# Patient Record
Sex: Male | Born: 1935 | Race: White | Hispanic: No | Marital: Married | Smoking: Never smoker
Health system: Southern US, Community
[De-identification: ages and names within clinical notes are randomized; demographics above are authoritative.]

## PROBLEM LIST (undated history)

## (undated) DIAGNOSIS — K219 Gastro-esophageal reflux disease without esophagitis: Secondary | ICD-10-CM

## (undated) DIAGNOSIS — E119 Type 2 diabetes mellitus without complications: Secondary | ICD-10-CM

## (undated) DIAGNOSIS — Z8489 Family history of other specified conditions: Secondary | ICD-10-CM

## (undated) DIAGNOSIS — R200 Anesthesia of skin: Secondary | ICD-10-CM

## (undated) DIAGNOSIS — K759 Inflammatory liver disease, unspecified: Secondary | ICD-10-CM

## (undated) DIAGNOSIS — IMO0001 Reserved for inherently not codable concepts without codable children: Secondary | ICD-10-CM

## (undated) DIAGNOSIS — B029 Zoster without complications: Secondary | ICD-10-CM

## (undated) DIAGNOSIS — B269 Mumps without complication: Secondary | ICD-10-CM

## (undated) DIAGNOSIS — J449 Chronic obstructive pulmonary disease, unspecified: Secondary | ICD-10-CM

## (undated) DIAGNOSIS — Z9221 Personal history of antineoplastic chemotherapy: Secondary | ICD-10-CM

## (undated) DIAGNOSIS — I10 Essential (primary) hypertension: Secondary | ICD-10-CM

## (undated) DIAGNOSIS — H547 Unspecified visual loss: Secondary | ICD-10-CM

## (undated) DIAGNOSIS — Z87898 Personal history of other specified conditions: Secondary | ICD-10-CM

## (undated) DIAGNOSIS — B059 Measles without complication: Secondary | ICD-10-CM

## (undated) DIAGNOSIS — C801 Malignant (primary) neoplasm, unspecified: Secondary | ICD-10-CM

## (undated) HISTORY — PX: BACK SURGERY: SHX140

## (undated) HISTORY — PX: OTHER SURGICAL HISTORY: SHX169

## (undated) HISTORY — PX: EYE SURGERY: SHX253

## (undated) HISTORY — PX: APPENDECTOMY: SHX54

---

## 2015-03-24 ENCOUNTER — Ambulatory Visit: Payer: Self-pay | Admitting: Orthopedic Surgery

## 2015-03-24 NOTE — H&P (Signed)
Christopher Jennings DOB: 08-25-1933 Married / Language: English / Race: White Male Date of Admission:  03/29/2015 CC:  Left hip pain following a fall History of Present Illness The patient is a 81 year old male who comes in  for a preoperative History and Physical. The patient is scheduled for a left hip pinning to be performed by Dr. Dione Plover. [name], MD at [facility] on 03/29/2015. The patient is a 81 year old male who presented with a left hip problem. The patient reports left hip problems including pain symptoms that have been present for 6 week(s). The symptoms began following a specific injury. The injury occurred due to a fall while the patient was at home. Prior to being seen, the patient was previously evaluated by a primary physician, in [place]. He has just recently moved here. Previous workup for this problem has included hip x-rays (showed impacted femoral neck fracture with severe underlying OA). Previous treatment for this problem has included restricted activity, use of a walker and opioid analgesics (Oxycodone, mainly at night). Note for "Hip problem": He has also been undergoing chemo; he reports his last session was last week. Christopher Jennings was seen in the office upon recommendation from his daughter who is a patient at [facility]. He came in for evaluation and further treatment for his left hip injury. He sustained a fall about 6-8 weeks ago at home when he was digging up the lid to his septic tank and unfortunately slipped on some loose dirt and fell and experienced pain in the left hip area. He was able to stand and walk on the leg so he did not seek medical treatment at that time. His pain was mainly load bearing, but no pain at rest or hardly any pain at night. He had an old crutch that he started to use for mobility. He has been using it up until just a couple of days ago when he had to go to a walker for better support. He delayed seeking treatment for the hip  due to other important ongoing issues. They were getting ready to move and just relocated last week from [place], [place] to [place], [place]. He has also been undergoing chemotherapy recently for a primary small cell carcinoma lung cancer which has already been found to have mestatasized to bone in the area of his low back and his shoulder. He states that he just finished up his chemotherapy this past Friday.  At this time., he has mainly weight bearing pain in the left hip which will radiate down the leg some. He also has some groin and buttock pain. No significant pain as rest and only a little at night when he moves around. He was seen at his PCP back in [place] about two weeks ago and sent for x-rays which proved positive for an impacted left femoral neck fracture. He waited until now to come in due to the recent move. The pain in the hip had started to get a little better but it changed for the worse about a week ago. He has been limited on his activity and movement recently which may just be due to the fact that he had to work more during the recent relocation from [place] to [place]. The femoral neck fracture was confirmed on radiograph in the office and surgery was discussed. Risks and benefits of the left hip pinning were discussed with the patient and he elects to proceed with surgery.  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 Congestive Heart Failure Mother. Depression child Cancer Sister. Heart disease in male family member  before age 29 Diabetes Mellitus Brother, Father, Sister. First Degree Relatives reported Heart Disease Brother.  Social History Exercise Exercises daily; does running / walking Living situation live with spouse Marital status married Current work status retired Children 3 Never consumed alcohol 03/23/2015: Never consumed alcohol Tobacco use Former smoker. 03/23/2015: smoke(d) 3 or more pack(s) per day uses less than 1/2 can(s) smokeless per week Tobacco / smoke exposure 03/23/2015: no No history of drug/alcohol rehab Not under pain contract Number of flights of stairs before winded 2-3  Medication History OxyCODONE HCl ER ('15MG'$  Tab 12HR Deter, Oral as needed) Active. Glimepiride (Oral) Specific dose unknown - Active. Zocor ('20MG'$  Tablet, Oral) Active. HumaLOG Mix 50/50 KwikPen ((50-50) 100UNIT/ML Susp Pen-inj, Subcutaneous) Active. Fentanyl (50MCG/HR , Transdermal) Active. Ondansetron ('8MG'$  Tablet Disperse, Oral) Active.  Past Surgical History  Appendectomy Cataract Surgery bilateral Spinal Surgery  Review of Systems General Present- Weight Loss. Not Present- Chills, Fatigue, Fever, Memory Loss, Night Sweats and Weight Gain. Skin Not Present- Eczema, Hives, Itching, Lesions and Rash. HEENT Not Present- Dentures, Double Vision, Headache, Hearing Loss, Tinnitus and Visual Loss. Respiratory Not Present- Allergies, Chronic Cough, Coughing up blood, Shortness of breath at rest and Shortness of breath with exertion. Cardiovascular Not Present- Chest Pain, Difficulty Breathing Lying Down, Murmur, Palpitations, Racing/skipping heartbeats and Swelling. Gastrointestinal Present- Vomiting (about 4 weeks ago due to chemo). Not Present- Abdominal Pain, Bloody Stool, Constipation, Diarrhea, Difficulty Swallowing, Heartburn, Jaundice, Loss of appetitie and Nausea. Male Genitourinary Present- Urinary frequency. Not Present- Blood in Urine, Discharge, Flank Pain,  Incontinence, Painful Urination, Urgency, Urinary Retention, Urinating at Night and Weak urinary stream. Musculoskeletal Present- Joint Pain (left hip load bearing pain). Not Present- Back Pain, Joint Swelling, Morning Stiffness, Muscle Pain, Muscle Weakness and Spasms. Neurological Not Present- Blackout spells, Difficulty with balance, Dizziness, Paralysis, Tremor and Weakness. Psychiatric Not Present- Insomnia.  Vitals  Weight: 182 lb Height: 73in Body Surface Area: 2.07 m Body Mass Index: 24.01 kg/m  BP: 114/58 (Sitting, Right Arm, Standard)  Physical Exam General Mental Status -Alert, cooperative and good historian. General Appearance-pleasant, Not in acute distress. Orientation-Oriented X3. Build & Nutrition-Well nourished and Well developed.  Head and Neck Head-normocephalic, atraumatic . Neck Global Assessment - supple, no bruit auscultated on the right, no bruit auscultated on the left.  Eye Vision-Wears corrective lenses. Note: blindness in left eye secondary to past shingles. Can only make out silhouettes. Reduce peripheral vision left eye Eyeball - Left -Note: injected. Pupil - Bilateral-Regular and Round. Motion - Bilateral-EOMI.  ENMT Note: upper and lower dentures  Chest and Lung Exam Auscultation Breath sounds - clear at anterior chest wall and clear at posterior chest wall. Adventitious sounds - No Adventitious sounds.  Cardiovascular Auscultation Rhythm - Regular rate and rhythm. Heart Sounds - S1 WNL and S2 WNL. Murmurs & Other Heart Sounds - Auscultation of the heart reveals - No Murmurs.  Abdomen Palpation/Percussion Tenderness - Abdomen is non-tender to palpation. Rigidity (guarding) - Abdomen is soft. Auscultation Auscultation of the abdomen reveals -  Bowel sounds normal.  Male Genitourinary Note: Not done, not pertinent to present illness   Musculoskeletal Note: Left hip flexion about 120, internal 25 with mild  discomfort, external 35 with mild discomfort, abduction limited in wheelchair of about 30. Motor function intact - moving foot and toes well on exam. Sensation intact to left leg.   Assessment & Plan Closed fracture of neck of left femur, initial encounter (S72.002A) Note:Surgical Plans: Left Hip Pinning  Disposition: Home  PCP: Does not have one yet  Anesthesia Issues: None  Signed electronically by Ok Edwards, III PA-C

## 2015-03-24 NOTE — Progress Notes (Signed)
Preoperative surgical orders have been place into the Epic hospital system for Christopher Jennings on 03/24/2015, 12:33 PM  by Mickel Crow for surgery on 03-29-2015.  Preop Hip Pinning orders including IV Tylenol, and IV Decadron as long as there are no contraindications to the above medications. Arlee Muslim, PA-C

## 2015-03-28 ENCOUNTER — Encounter (HOSPITAL_COMMUNITY)
Admission: RE | Admit: 2015-03-28 | Discharge: 2015-03-28 | Disposition: A | Discharge status: Home / Self Care | Payer: Medicare PPO | Source: Ambulatory Visit | Attending: Orthopedic Surgery | Admitting: Orthopedic Surgery

## 2015-03-28 ENCOUNTER — Ambulatory Visit (HOSPITAL_COMMUNITY)
Admission: RE | Admit: 2015-03-28 | Discharge: 2015-03-28 | Disposition: A | Discharge status: Home / Self Care | Payer: Medicare PPO | Source: Ambulatory Visit | Attending: Anesthesiology | Admitting: Anesthesiology

## 2015-03-28 ENCOUNTER — Encounter (HOSPITAL_COMMUNITY): Payer: Self-pay

## 2015-03-28 DIAGNOSIS — E119 Type 2 diabetes mellitus without complications: Secondary | ICD-10-CM

## 2015-03-28 DIAGNOSIS — Z85118 Personal history of other malignant neoplasm of bronchus and lung: Secondary | ICD-10-CM | POA: Insufficient documentation

## 2015-03-28 DIAGNOSIS — Z87891 Personal history of nicotine dependence: Secondary | ICD-10-CM

## 2015-03-28 DIAGNOSIS — J8410 Pulmonary fibrosis, unspecified: Secondary | ICD-10-CM

## 2015-03-28 DIAGNOSIS — Z01810 Encounter for preprocedural cardiovascular examination: Secondary | ICD-10-CM

## 2015-03-28 HISTORY — DX: Anesthesia of skin: R20.0

## 2015-03-28 HISTORY — DX: Malignant (primary) neoplasm, unspecified: C80.1

## 2015-03-28 HISTORY — DX: Personal history of other specified conditions: Z87.898

## 2015-03-28 HISTORY — DX: Zoster without complications: B02.9

## 2015-03-28 HISTORY — DX: Essential (primary) hypertension: I10

## 2015-03-28 HISTORY — DX: Personal history of antineoplastic chemotherapy: Z92.21

## 2015-03-28 HISTORY — DX: Type 2 diabetes mellitus without complications: E11.9

## 2015-03-28 HISTORY — DX: Reserved for inherently not codable concepts without codable children: IMO0001

## 2015-03-28 HISTORY — DX: Unspecified visual loss: H54.7

## 2015-03-28 HISTORY — DX: Family history of other specified conditions: Z84.89

## 2015-03-28 HISTORY — DX: Mumps without complication: B26.9

## 2015-03-28 HISTORY — DX: Chronic obstructive pulmonary disease, unspecified: J44.9

## 2015-03-28 HISTORY — DX: Inflammatory liver disease, unspecified: K75.9

## 2015-03-28 HISTORY — DX: Measles without complication: B05.9

## 2015-03-28 HISTORY — DX: Gastro-esophageal reflux disease without esophagitis: K21.9

## 2015-03-28 LAB — COMPREHENSIVE METABOLIC PANEL
ALBUMIN: 3.5 g/dL (ref 3.5–5.0)
ALK PHOS: 169 U/L — AB (ref 38–126)
ALT: 10 U/L — ABNORMAL LOW (ref 17–63)
AST: 22 U/L (ref 15–41)
Anion gap: 6 (ref 5–15)
BUN: 12 mg/dL (ref 6–20)
CO2: 26 mmol/L (ref 22–32)
Calcium: 8.8 mg/dL — ABNORMAL LOW (ref 8.9–10.3)
Chloride: 101 mmol/L (ref 101–111)
Creatinine, Ser: 0.88 mg/dL (ref 0.61–1.24)
Glucose, Bld: 209 mg/dL — ABNORMAL HIGH (ref 65–99)
Potassium: 4.2 mmol/L (ref 3.5–5.1)
Sodium: 133 mmol/L — ABNORMAL LOW (ref 135–145)
TOTAL PROTEIN: 8.1 g/dL (ref 6.5–8.1)
Total Bilirubin: 0.8 mg/dL (ref 0.3–1.2)

## 2015-03-28 LAB — APTT: aPTT: 29 seconds (ref 24–37)

## 2015-03-28 LAB — URINALYSIS, ROUTINE W REFLEX MICROSCOPIC
Bilirubin Urine: NEGATIVE
Glucose, UA: >1000 mg/dL — AB
HGB URINE DIPSTICK: NEGATIVE
Ketones, ur: NEGATIVE mg/dL
Leukocytes, UA: NEGATIVE
NITRITE: NEGATIVE
PROTEIN: 30 mg/dL — AB
SPECIFIC GRAVITY, URINE: 1.027 (ref 1.005–1.030)
Urobilinogen, UA: 1 mg/dL (ref 0.0–1.0)
pH: 7 (ref 5.0–8.0)

## 2015-03-28 LAB — CBC
HEMATOCRIT: 30.7 % — AB (ref 39.0–52.0)
Hemoglobin: 10 g/dL — ABNORMAL LOW (ref 13.0–17.0)
MCH: 29.9 pg (ref 26.0–34.0)
MCHC: 32.6 g/dL (ref 30.0–36.0)
MCV: 91.6 fL (ref 78.0–100.0)
PLATELETS: 109 10*3/uL — AB (ref 150–400)
RBC: 3.35 MIL/uL — ABNORMAL LOW (ref 4.22–5.81)
RDW: 20.4 % — ABNORMAL HIGH (ref 11.5–15.5)
WBC: 4.2 10*3/uL (ref 4.0–10.5)

## 2015-03-28 LAB — URINE MICROSCOPIC-ADD ON

## 2015-03-28 LAB — ABO/RH: ABO/RH(D): A POS

## 2015-03-28 LAB — PROTIME-INR
INR: 1.1 (ref 0.00–1.49)
Prothrombin Time: 14.4 seconds (ref 11.6–15.2)

## 2015-03-28 LAB — SURGICAL PCR SCREEN
MRSA, PCR: NEGATIVE
Staphylococcus aureus: NEGATIVE

## 2015-03-28 NOTE — Patient Instructions (Signed)
Christopher Jennings  03/28/2015   Your procedure is scheduled on: Tuesday July, 19, 2016  Report to [facility]  Entrance take [place]  elevators to 3rd floor to  [place] at 12:00 PM.  Call this number if you have problems the morning of surgery [phone]   Remember: ONLY 1 PERSON MAY GO WITH YOU TO SHORT STAY TO GET  READY MORNING OF [place].  Do not eat food After Midnight but may take clear liquids till 8:00 am day of surgery then nothing by mouth. EAT A HEALTHY SNACK NIGHT PRIOR TO SURGERY.     Take these medicines the morning of surgery with A SIP OF WATER: oxycodone if needed;         TAKE 1/2 OF USUAL DOSE OF INSULIN NIGHT PRIOR TO            SURGERY.                               You may not have any metal on your body including hair pins and              piercings  Do not wear jewelry, lotions, powders or colognes, deodorant                           Men may shave face and neck.   Do not bring valuables to the hospital. [place].  Contacts, dentures or bridgework may not be worn into surgery.  Leave suitcase in the car. After surgery it may be brought to your room.                  Please read over the following fact sheets you were given: MRSA INFORMATION SHEET; INCENTIVE SPIROMETER; BLOOD TRANSFUSION INFORMATION SHEET _____________________________________________________________________             [facility] - Preparing for Surgery Before surgery, you can play an important role.  Because skin is not sterile, your skin needs to be as free of germs as possible.  You can reduce the number of germs on your skin by washing with CHG (chlorahexidine gluconate) soap before surgery.  CHG is an antiseptic cleaner which kills germs and bonds with the skin to continue killing germs even after washing. Please DO NOT use if you have an allergy to CHG or antibacterial soaps.  If your skin becomes  reddened/irritated stop using the CHG and inform your nurse when you arrive at Short Stay. Do not shave (including legs and underarms) for at least 48 hours prior to the first CHG shower.  You may shave your face/neck. Please follow these instructions carefully:  1.  Shower with CHG Soap the night before surgery and the  morning of Surgery.  2.  If you choose to wash your hair, wash your hair first as usual with your  normal  shampoo.  3.  After you shampoo, rinse your hair and body thoroughly to remove the  shampoo.                           4.  Use CHG as you would any other liquid soap.  You  can apply chg directly  to the skin and wash                       Gently with a scrungie or clean washcloth.  5.  Apply the CHG Soap to your body ONLY FROM THE NECK DOWN.   Do not use on face/ open                           Wound or open sores. Avoid contact with eyes, ears mouth and genitals (private parts).                       Wash face,  Genitals (private parts) with your normal soap.             6.  Wash thoroughly, paying special attention to the area where your surgery  will be performed.  7.  Thoroughly rinse your body with warm water from the neck down.  8.  DO NOT shower/wash with your normal soap after using and rinsing off  the CHG Soap.                9.  Pat yourself dry with a clean towel.            10.  Wear clean pajamas.            11.  Place clean sheets on your bed the night of your first shower and do not  sleep with pets. Day of Surgery : Do not apply any lotions/deodorants the morning of surgery.  Please wear clean clothes to the hospital/surgery center.  FAILURE TO FOLLOW THESE INSTRUCTIONS MAY RESULT IN THE CANCELLATION OF YOUR SURGERY PATIENT SIGNATURE_________________________________  NURSE SIGNATURE__________________________________  ________________________________________________________________________    CLEAR LIQUID DIET   Foods Allowed                                                                      Foods Excluded  Coffee and tea, regular and decaf                             liquids that you cannot  Plain Jell-O in any flavor                                             see through such as: Fruit ices (not with fruit pulp)                                     milk, soups, orange juice  Iced Popsicles                                    All solid food Carbonated beverages, regular and diet  Cranberry, grape and apple juices Sports drinks like Gatorade Lightly seasoned clear broth or consume(fat free) Sugar, honey syrup  Sample Menu Breakfast                                Lunch                                     Supper Cranberry juice                    Beef broth                            Chicken broth Jell-O                                     Grape juice                           Apple juice Coffee or tea                        Jell-O                                      Popsicle                                                Coffee or tea                        Coffee or tea  _____________________________________________________________________    Incentive Spirometer  An incentive spirometer is a tool that can help keep your lungs clear and active. This tool measures how well you are filling your lungs with each breath. Taking long deep breaths may help reverse or decrease the chance of developing breathing (pulmonary) problems (especially infection) following:  A long period of time when you are unable to move or be active. BEFORE THE PROCEDURE   If the spirometer includes an indicator to show your best effort, your nurse or respiratory therapist will set it to a desired goal.  If possible, sit up straight or lean slightly forward. Try not to slouch.  Hold the incentive spirometer in an upright position. INSTRUCTIONS FOR USE   Sit on the edge of your bed if possible, or sit up as far as you  can in bed or on a chair.  Hold the incentive spirometer in an upright position.  Breathe out normally.  Place the mouthpiece in your mouth and seal your lips tightly around it.  Breathe in slowly and as deeply as possible, raising the piston or the ball toward the top of the column.  Hold your breath for 3-5 seconds or for as long as possible. Allow the piston or ball to fall to the bottom of the column.  Remove the mouthpiece from your mouth and breathe out normally.  Rest for a few seconds and repeat Steps 1 through 7 at  least 10 times every 1-2 hours when you are awake. Take your time and take a few normal breaths between deep breaths.  The spirometer may include an indicator to show your best effort. Use the indicator as a goal to work toward during each repetition.  After each set of 10 deep breaths, practice coughing to be sure your lungs are clear. If you have an incision (the cut made at the time of surgery), support your incision when coughing by placing a pillow or rolled up towels firmly against it. Once you are able to get out of bed, walk around indoors and cough well. You may stop using the incentive spirometer when instructed by your caregiver.  RISKS AND COMPLICATIONS  Take your time so you do not get dizzy or light-headed.  If you are in pain, you may need to take or ask for pain medication before doing incentive spirometry. It is harder to take a deep breath if you are having pain. AFTER USE  Rest and breathe slowly and easily.  It can be helpful to keep track of a log of your progress. Your caregiver can provide you with a simple table to help with this. If you are using the spirometer at home, follow these instructions: [place] IF:   You are having difficultly using the spirometer.  You have trouble using the spirometer as often as instructed.  Your pain medication is not giving enough relief while using the spirometer.  You develop fever of  100.5 F (38.1 C) or higher. SEEK IMMEDIATE MEDICAL CARE IF:   You cough up bloody sputum that had not been present before.  You develop fever of 102 F (38.9 C) or greater.  You develop worsening pain at or near the incision site. MAKE SURE YOU:   Understand these instructions.  Will watch your condition.  Will get help right away if you are not doing well or get worse. Document Released: 01/07/2007 Document Revised: 11/19/2011 Document Reviewed: 03/10/2007 ExitCare Patient Information 2014 Exit[employer], [place].   ________________________________________________________________________  WHAT IS A BLOOD TRANSFUSION? Blood Transfusion Information  A transfusion is the replacement of blood or some of its parts. Blood is made up of multiple cells which provide different functions.  Red blood cells carry oxygen and are used for blood loss replacement.  White blood cells fight against infection.  Platelets control bleeding.  Plasma helps clot blood.  Other blood products are available for specialized needs, such as hemophilia or other clotting disorders. BEFORE THE TRANSFUSION  Who gives blood for transfusions?   Healthy volunteers who are fully evaluated to make sure their blood is safe. This is blood bank blood. Transfusion therapy is the safest it has ever been in the practice of medicine. Before blood is taken from a donor, a complete history is taken to make sure that person has no history of diseases nor engages in risky social behavior (examples are intravenous drug use or sexual activity with multiple partners). The donor's travel history is screened to minimize risk of transmitting infections, such as malaria. The donated blood is tested for signs of infectious diseases, such as HIV and hepatitis. The blood is then tested to be sure it is compatible with you in order to minimize the chance of a transfusion reaction. If you or a relative donates blood, this is often done in  anticipation of surgery and is not appropriate for emergency situations. It takes many days to process the donated blood. RISKS AND COMPLICATIONS Although transfusion  therapy is very safe and saves many lives, the main dangers of transfusion include:   Getting an infectious disease.  Developing a transfusion reaction. This is an allergic reaction to something in the blood you were given. Every precaution is taken to prevent this. The decision to have a blood transfusion has been considered carefully by your caregiver before blood is given. Blood is not given unless the benefits outweigh the risks. AFTER THE TRANSFUSION  Right after receiving a blood transfusion, you will usually feel much better and more energetic. This is especially true if your red blood cells have gotten low (anemic). The transfusion raises the level of the red blood cells which carry oxygen, and this usually causes an energy increase.  The nurse administering the transfusion will monitor you carefully for complications. HOME CARE INSTRUCTIONS  No special instructions are needed after a transfusion. You may find your energy is better. Speak with your caregiver about any limitations on activity for underlying diseases you may have. SEEK MEDICAL CARE IF:   Your condition is not improving after your transfusion.  You develop redness or irritation at the intravenous (IV) site. SEEK IMMEDIATE MEDICAL CARE IF:  Any of the following symptoms occur over the next 12 hours:  Shaking chills.  You have a temperature by mouth above 102 F (38.9 C), not controlled by medicine.  Chest, back, or muscle pain.  People around you feel you are not acting correctly or are confused.  Shortness of breath or difficulty breathing.  Dizziness and fainting.  You get a rash or develop hives.  You have a decrease in urine output.  Your urine turns a dark color or changes to pink, red, or brown. Any of the following symptoms occur over  the next 10 days:  You have a temperature by mouth above 102 F (38.9 C), not controlled by medicine.  Shortness of breath.  Weakness after normal activity.  The white part of the eye turns yellow (jaundice).  You have a decrease in the amount of urine or are urinating less often.  Your urine turns a dark color or changes to pink, red, or brown. Document Released: 08/24/2000 Document Revised: 11/19/2011 Document Reviewed: 04/12/2008 [facility] Patient Information 2014 [place], [place].  _______________________________________________________________________

## 2015-03-28 NOTE — Progress Notes (Addendum)
CBC, CMP and urinalysis results in epic per PAT visit 03/28/2015 sent to Dr Wynelle Link

## 2015-03-28 NOTE — Progress Notes (Signed)
Reviewed H&P, labs, and CXR with anesthesia/Dr Marcell Barlow. Anesthesia to see pt day of surgery.

## 2015-03-29 ENCOUNTER — Inpatient Hospital Stay (HOSPITAL_COMMUNITY): Payer: Medicare PPO

## 2015-03-29 ENCOUNTER — Encounter (HOSPITAL_COMMUNITY)
Admission: RE | Disposition: A | Discharge status: Home Health | Payer: Self-pay | Source: Ambulatory Visit | Attending: Orthopedic Surgery

## 2015-03-29 ENCOUNTER — Inpatient Hospital Stay (HOSPITAL_COMMUNITY): Payer: Medicare PPO | Admitting: Anesthesiology

## 2015-03-29 ENCOUNTER — Inpatient Hospital Stay (HOSPITAL_COMMUNITY)
Admission: RE | Admit: 2015-03-29 | Discharge: 2015-03-30 | DRG: 481 | Disposition: A | Discharge status: Home Health | Payer: Medicare PPO | Source: Ambulatory Visit | Attending: Orthopedic Surgery | Admitting: Orthopedic Surgery

## 2015-03-29 ENCOUNTER — Encounter (HOSPITAL_COMMUNITY): Payer: Self-pay | Admitting: *Deleted

## 2015-03-29 DIAGNOSIS — K759 Inflammatory liver disease, unspecified: Secondary | ICD-10-CM | POA: Diagnosis present

## 2015-03-29 DIAGNOSIS — Z01810 Encounter for preprocedural cardiovascular examination: Secondary | ICD-10-CM

## 2015-03-29 DIAGNOSIS — Z01818 Encounter for other preprocedural examination: Secondary | ICD-10-CM | POA: Diagnosis not present

## 2015-03-29 DIAGNOSIS — Z8781 Personal history of (healed) traumatic fracture: Secondary | ICD-10-CM

## 2015-03-29 DIAGNOSIS — Z87891 Personal history of nicotine dependence: Secondary | ICD-10-CM

## 2015-03-29 DIAGNOSIS — Z85828 Personal history of other malignant neoplasm of skin: Secondary | ICD-10-CM

## 2015-03-29 DIAGNOSIS — C419 Malignant neoplasm of bone and articular cartilage, unspecified: Secondary | ICD-10-CM | POA: Diagnosis present

## 2015-03-29 DIAGNOSIS — S72002A Fracture of unspecified part of neck of left femur, initial encounter for closed fracture: Secondary | ICD-10-CM

## 2015-03-29 DIAGNOSIS — Z833 Family history of diabetes mellitus: Secondary | ICD-10-CM

## 2015-03-29 DIAGNOSIS — Z823 Family history of stroke: Secondary | ICD-10-CM

## 2015-03-29 DIAGNOSIS — H540 Blindness, both eyes: Secondary | ICD-10-CM | POA: Diagnosis present

## 2015-03-29 DIAGNOSIS — Z79899 Other long term (current) drug therapy: Secondary | ICD-10-CM | POA: Diagnosis not present

## 2015-03-29 DIAGNOSIS — C3490 Malignant neoplasm of unspecified part of unspecified bronchus or lung: Secondary | ICD-10-CM | POA: Diagnosis present

## 2015-03-29 DIAGNOSIS — Z8249 Family history of ischemic heart disease and other diseases of the circulatory system: Secondary | ICD-10-CM

## 2015-03-29 DIAGNOSIS — Z9221 Personal history of antineoplastic chemotherapy: Secondary | ICD-10-CM | POA: Diagnosis not present

## 2015-03-29 DIAGNOSIS — Y92017 Garden or yard in single-family (private) house as the place of occurrence of the external cause: Secondary | ICD-10-CM | POA: Diagnosis not present

## 2015-03-29 DIAGNOSIS — Z01812 Encounter for preprocedural laboratory examination: Secondary | ICD-10-CM

## 2015-03-29 DIAGNOSIS — K219 Gastro-esophageal reflux disease without esophagitis: Secondary | ICD-10-CM | POA: Diagnosis present

## 2015-03-29 DIAGNOSIS — Z79891 Long term (current) use of opiate analgesic: Secondary | ICD-10-CM

## 2015-03-29 DIAGNOSIS — Z794 Long term (current) use of insulin: Secondary | ICD-10-CM

## 2015-03-29 DIAGNOSIS — M1612 Unilateral primary osteoarthritis, left hip: Secondary | ICD-10-CM | POA: Diagnosis present

## 2015-03-29 DIAGNOSIS — J449 Chronic obstructive pulmonary disease, unspecified: Secondary | ICD-10-CM | POA: Diagnosis present

## 2015-03-29 DIAGNOSIS — S72012A Unspecified intracapsular fracture of left femur, initial encounter for closed fracture: Principal | ICD-10-CM | POA: Diagnosis present

## 2015-03-29 DIAGNOSIS — I10 Essential (primary) hypertension: Secondary | ICD-10-CM | POA: Diagnosis present

## 2015-03-29 DIAGNOSIS — E119 Type 2 diabetes mellitus without complications: Secondary | ICD-10-CM | POA: Diagnosis present

## 2015-03-29 DIAGNOSIS — M25552 Pain in left hip: Secondary | ICD-10-CM | POA: Diagnosis present

## 2015-03-29 DIAGNOSIS — W010XXA Fall on same level from slipping, tripping and stumbling without subsequent striking against object, initial encounter: Secondary | ICD-10-CM | POA: Diagnosis present

## 2015-03-29 DIAGNOSIS — S72009A Fracture of unspecified part of neck of unspecified femur, initial encounter for closed fracture: Secondary | ICD-10-CM | POA: Diagnosis present

## 2015-03-29 HISTORY — PX: PERCUTANEOUS PINNING: SHX2209

## 2015-03-29 LAB — GLUCOSE, CAPILLARY
GLUCOSE-CAPILLARY: 287 mg/dL — AB (ref 65–99)
Glucose-Capillary: 136 mg/dL — ABNORMAL HIGH (ref 65–99)
Glucose-Capillary: 112 mg/dL — ABNORMAL HIGH (ref 65–99)

## 2015-03-29 LAB — TYPE AND SCREEN
ABO/RH(D): A POS
Antibody Screen: NEGATIVE

## 2015-03-29 SURGERY — PINNING, EXTREMITY, PERCUTANEOUS
Anesthesia: Spinal | Site: Hip | Laterality: Left

## 2015-03-29 MED ORDER — ONDANSETRON 4 MG PO TBDP: 8.0000 mg | ORAL_TABLET | Freq: Three times a day (TID) | ORAL | Status: DC | PRN

## 2015-03-29 MED ORDER — PROPOFOL INFUSION 10 MG/ML OPTIME
INTRAVENOUS | Status: DC | PRN
  Administered 2015-03-29: 75 ug/kg/min via INTRAVENOUS

## 2015-03-29 MED ORDER — MIDAZOLAM HCL 2 MG/2ML IJ SOLN
INTRAMUSCULAR | Status: AC
Start: 2015-03-29 — End: 2015-03-29
  Filled 2015-03-29: qty 2

## 2015-03-29 MED ORDER — DOCUSATE SODIUM 100 MG PO CAPS: 100.0000 mg | ORAL_CAPSULE | Freq: Two times a day (BID) | ORAL | Status: DC | PRN

## 2015-03-29 MED ORDER — GLIMEPIRIDE 4 MG PO TABS
4.0000 mg | ORAL_TABLET | Freq: Two times a day (BID) | ORAL | Status: DC
  Administered 2015-03-30: 4 mg via ORAL
  Filled 2015-03-29 (×2): qty 1

## 2015-03-29 MED ORDER — PROPOFOL 10 MG/ML IV BOLUS
INTRAVENOUS | Status: AC
  Filled 2015-03-29: qty 20

## 2015-03-29 MED ORDER — PHENYLEPHRINE HCL 10 MG/ML IJ SOLN
INTRAMUSCULAR | Status: DC | PRN
  Administered 2015-03-29: 40 ug via INTRAVENOUS

## 2015-03-29 MED ORDER — MENTHOL 3 MG MT LOZG: 1.0000 | LOZENGE | OROMUCOSAL | Status: DC | PRN

## 2015-03-29 MED ORDER — METOCLOPRAMIDE HCL 10 MG PO TABS: 5.0000 mg | ORAL_TABLET | Freq: Three times a day (TID) | ORAL | Status: DC | PRN

## 2015-03-29 MED ORDER — ONDANSETRON HCL 4 MG/2ML IJ SOLN
INTRAMUSCULAR | Status: AC
  Filled 2015-03-29: qty 2

## 2015-03-29 MED ORDER — ENOXAPARIN SODIUM 40 MG/0.4ML ~~LOC~~ SOLN
40.0000 mg | SUBCUTANEOUS | Status: DC
  Administered 2015-03-30: 40 mg via SUBCUTANEOUS
  Filled 2015-03-29 (×2): qty 0.4

## 2015-03-29 MED ORDER — DEXTROSE 5 % IV SOLN
10.0000 mg | INTRAVENOUS | Status: DC | PRN
  Administered 2015-03-29: 40 ug/min via INTRAVENOUS

## 2015-03-29 MED ORDER — SIMVASTATIN 20 MG PO TABS
20.0000 mg | ORAL_TABLET | Freq: Every day | ORAL | Status: DC
  Administered 2015-03-29 – 2015-03-30 (×2): 20 mg via ORAL
  Filled 2015-03-29 (×2): qty 1

## 2015-03-29 MED ORDER — FENTANYL CITRATE (PF) 100 MCG/2ML IJ SOLN
INTRAMUSCULAR | Status: AC
Start: 2015-03-29 — End: 2015-03-29
  Filled 2015-03-29: qty 2

## 2015-03-29 MED ORDER — ACETAMINOPHEN 10 MG/ML IV SOLN
INTRAVENOUS | Status: AC
  Filled 2015-03-29: qty 100

## 2015-03-29 MED ORDER — MORPHINE SULFATE 2 MG/ML IJ SOLN: 1.0000 mg | INTRAMUSCULAR | Status: DC | PRN

## 2015-03-29 MED ORDER — ACETAMINOPHEN 650 MG RE SUPP: 650.0000 mg | Freq: Four times a day (QID) | RECTAL | Status: DC | PRN

## 2015-03-29 MED ORDER — CYCLOBENZAPRINE HCL 10 MG PO TABS
10.0000 mg | ORAL_TABLET | Freq: Three times a day (TID) | ORAL | Status: DC | PRN
  Administered 2015-03-30: 10 mg via ORAL
  Filled 2015-03-29 (×2): qty 1

## 2015-03-29 MED ORDER — POLYETHYLENE GLYCOL 3350 17 G PO PACK
17.0000 g | PACK | Freq: Every day | ORAL | Status: DC
  Administered 2015-03-29 – 2015-03-30 (×2): 17 g via ORAL

## 2015-03-29 MED ORDER — DEXAMETHASONE SODIUM PHOSPHATE 10 MG/ML IJ SOLN
10.0000 mg | Freq: Once | INTRAMUSCULAR | Status: AC
  Administered 2015-03-29: 10 mg via INTRAVENOUS

## 2015-03-29 MED ORDER — FENTANYL CITRATE (PF) 100 MCG/2ML IJ SOLN
INTRAMUSCULAR | Status: DC | PRN
  Administered 2015-03-29: 50 ug via INTRAVENOUS

## 2015-03-29 MED ORDER — FENTANYL 50 MCG/HR TD PT72
50.0000 ug | MEDICATED_PATCH | TRANSDERMAL | Status: DC
  Administered 2015-03-30: 50 ug via TRANSDERMAL
  Filled 2015-03-29: qty 1

## 2015-03-29 MED ORDER — PROMETHAZINE HCL 25 MG/ML IJ SOLN
INTRAMUSCULAR | Status: AC
  Filled 2015-03-29: qty 1

## 2015-03-29 MED ORDER — ACETAMINOPHEN 325 MG PO TABS: 650.0000 mg | ORAL_TABLET | Freq: Four times a day (QID) | ORAL | Status: DC | PRN

## 2015-03-29 MED ORDER — INSULIN ASPART 100 UNIT/ML ~~LOC~~ SOLN
0.0000 [IU] | Freq: Three times a day (TID) | SUBCUTANEOUS | Status: DC
  Administered 2015-03-30 (×2): 3 [IU] via SUBCUTANEOUS

## 2015-03-29 MED ORDER — ONDANSETRON HCL 4 MG PO TABS: 4.0000 mg | ORAL_TABLET | Freq: Four times a day (QID) | ORAL | Status: DC | PRN

## 2015-03-29 MED ORDER — PHENYLEPHRINE HCL 10 MG/ML IJ SOLN
INTRAMUSCULAR | Status: AC
  Filled 2015-03-29: qty 1

## 2015-03-29 MED ORDER — CEFAZOLIN SODIUM-DEXTROSE 2-3 GM-% IV SOLR
2.0000 g | INTRAVENOUS | Status: AC
  Administered 2015-03-29: 2 g via INTRAVENOUS

## 2015-03-29 MED ORDER — PHENYLEPHRINE 40 MCG/ML (10ML) SYRINGE FOR IV PUSH (FOR BLOOD PRESSURE SUPPORT)
PREFILLED_SYRINGE | INTRAVENOUS | Status: AC
  Filled 2015-03-29: qty 10

## 2015-03-29 MED ORDER — LACTATED RINGERS IV SOLN
INTRAVENOUS | Status: DC
  Administered 2015-03-29: 1000 mL via INTRAVENOUS

## 2015-03-29 MED ORDER — MIDAZOLAM HCL 5 MG/5ML IJ SOLN
INTRAMUSCULAR | Status: DC | PRN
  Administered 2015-03-29: 1 mg via INTRAVENOUS

## 2015-03-29 MED ORDER — DEXAMETHASONE SODIUM PHOSPHATE 10 MG/ML IJ SOLN
INTRAMUSCULAR | Status: AC
  Filled 2015-03-29: qty 1

## 2015-03-29 MED ORDER — CHLORHEXIDINE GLUCONATE 4 % EX LIQD: 60.0000 mL | Freq: Once | CUTANEOUS | Status: DC

## 2015-03-29 MED ORDER — HYDROMORPHONE HCL 1 MG/ML IJ SOLN: 0.2500 mg | INTRAMUSCULAR | Status: DC | PRN

## 2015-03-29 MED ORDER — BUPIVACAINE IN DEXTROSE 0.75-8.25 % IT SOLN
INTRATHECAL | Status: DC | PRN
  Administered 2015-03-29: 1.8 mL via INTRATHECAL

## 2015-03-29 MED ORDER — FLEET ENEMA 7-19 GM/118ML RE ENEM: 1.0000 | ENEMA | Freq: Once | RECTAL | Status: AC | PRN

## 2015-03-29 MED ORDER — METOCLOPRAMIDE HCL 5 MG/ML IJ SOLN
5.0000 mg | Freq: Three times a day (TID) | INTRAMUSCULAR | Status: DC | PRN
Start: 2015-03-29 — End: 2015-03-30

## 2015-03-29 MED ORDER — ACETAMINOPHEN 10 MG/ML IV SOLN
1000.0000 mg | Freq: Once | INTRAVENOUS | Status: AC
  Administered 2015-03-29: 1000 mg via INTRAVENOUS
  Filled 2015-03-29: qty 100

## 2015-03-29 MED ORDER — ONDANSETRON HCL 4 MG/2ML IJ SOLN
INTRAMUSCULAR | Status: DC | PRN
  Administered 2015-03-29: 4 mg via INTRAVENOUS

## 2015-03-29 MED ORDER — SODIUM CHLORIDE 0.9 % IV SOLN: INTRAVENOUS | Status: DC

## 2015-03-29 MED ORDER — FENTANYL CITRATE (PF) 100 MCG/2ML IJ SOLN: INTRAMUSCULAR | Status: DC | PRN

## 2015-03-29 MED ORDER — ONDANSETRON HCL 4 MG/2ML IJ SOLN: 4.0000 mg | Freq: Four times a day (QID) | INTRAMUSCULAR | Status: DC | PRN

## 2015-03-29 MED ORDER — PROMETHAZINE HCL 25 MG/ML IJ SOLN
6.2500 mg | INTRAMUSCULAR | Status: DC | PRN
Start: 2015-03-29 — End: 2015-03-29

## 2015-03-29 MED ORDER — BISACODYL 10 MG RE SUPP: 10.0000 mg | Freq: Every day | RECTAL | Status: DC | PRN

## 2015-03-29 MED ORDER — PROPOFOL 10 MG/ML IV BOLUS
INTRAVENOUS | Status: DC | PRN
  Administered 2015-03-29: 20 mg via INTRAVENOUS

## 2015-03-29 MED ORDER — CEFAZOLIN SODIUM-DEXTROSE 2-3 GM-% IV SOLR
2.0000 g | Freq: Four times a day (QID) | INTRAVENOUS | Status: AC
  Administered 2015-03-29 – 2015-03-30 (×2): 2 g via INTRAVENOUS
  Filled 2015-03-29 (×2): qty 50

## 2015-03-29 MED ORDER — SODIUM CHLORIDE 0.9 % IV SOLN
INTRAVENOUS | Status: DC
  Administered 2015-03-29: 20:00:00 via INTRAVENOUS

## 2015-03-29 MED ORDER — PHENOL 1.4 % MT LIQD: 1.0000 | OROMUCOSAL | Status: DC | PRN

## 2015-03-29 MED ORDER — OXYCODONE HCL 5 MG PO TABS
10.0000 mg | ORAL_TABLET | ORAL | Status: DC | PRN
  Administered 2015-03-29: 10 mg via ORAL
  Administered 2015-03-29 – 2015-03-30 (×4): 20 mg via ORAL
  Filled 2015-03-29: qty 2
  Filled 2015-03-29 (×4): qty 4

## 2015-03-29 MED ORDER — CEFAZOLIN SODIUM-DEXTROSE 2-3 GM-% IV SOLR
INTRAVENOUS | Status: AC
  Filled 2015-03-29: qty 50

## 2015-03-29 MED ORDER — TRAMADOL HCL 50 MG PO TABS: 50.0000 mg | ORAL_TABLET | Freq: Four times a day (QID) | ORAL | Status: DC | PRN

## 2015-03-29 SURGICAL SUPPLY — 40 items
BAG ZIPLOCK 12X15 (MISCELLANEOUS) ×3 IMPLANT
BNDG GAUZE ELAST 4 BULKY (GAUZE/BANDAGES/DRESSINGS) ×3 IMPLANT
CLOSURE WOUND 1/2 X4 (GAUZE/BANDAGES/DRESSINGS) ×1
DRAPE STERI IOBAN 125X83 (DRAPES) ×3 IMPLANT
DRSG EMULSION OIL 3X16 NADH (GAUZE/BANDAGES/DRESSINGS) IMPLANT
DRSG MEPILEX BORDER 4X4 (GAUZE/BANDAGES/DRESSINGS) ×3 IMPLANT
DRSG MEPILEX BORDER 4X8 (GAUZE/BANDAGES/DRESSINGS) IMPLANT
DRSG PAD ABDOMINAL 8X10 ST (GAUZE/BANDAGES/DRESSINGS) IMPLANT
DURAPREP 26ML APPLICATOR (WOUND CARE) ×3 IMPLANT
ELECT REM PT RETURN 9FT ADLT (ELECTROSURGICAL) ×3
ELECTRODE REM PT RTRN 9FT ADLT (ELECTROSURGICAL) ×1 IMPLANT
EVACUATOR 1/8 PVC DRAIN (DRAIN) ×3 IMPLANT
GAUZE SPONGE 4X4 12PLY STRL (GAUZE/BANDAGES/DRESSINGS) IMPLANT
GLOVE BIO SURGEON STRL SZ7.5 (GLOVE) ×3 IMPLANT
GLOVE BIO SURGEON STRL SZ8 (GLOVE) ×6 IMPLANT
GLOVE BIOGEL PI IND STRL 8 (GLOVE) ×3 IMPLANT
GLOVE BIOGEL PI INDICATOR 8 (GLOVE) ×6
GOWN STRL REUS W/TWL LRG LVL3 (GOWN DISPOSABLE) ×3 IMPLANT
GOWN STRL REUS W/TWL XL LVL3 (GOWN DISPOSABLE) ×3 IMPLANT
MANIFOLD NEPTUNE II (INSTRUMENTS) ×3 IMPLANT
NS IRRIG 1000ML POUR BTL (IV SOLUTION) ×3 IMPLANT
PACK GENERAL/GYN (CUSTOM PROCEDURE TRAY) ×3 IMPLANT
PAD CAST 4YDX4 CTTN HI CHSV (CAST SUPPLIES) IMPLANT
PADDING CAST COTTON 4X4 STRL (CAST SUPPLIES)
PADDING CAST COTTON 6X4 STRL (CAST SUPPLIES) ×3 IMPLANT
PIN GUIDE DRILL TIP 2.8X300 (DRILL) ×9 IMPLANT
POSITIONER SURGICAL ARM (MISCELLANEOUS) ×3 IMPLANT
SCREW CANN 90X16X6.5 NS (Screw) ×1 IMPLANT
SCREW CANNULATED 6.5X90MM (Screw) ×2 IMPLANT
SCREW CANNULATED 6.5X95MM (Screw) ×6 IMPLANT
SPONGE LAP 18X18 X RAY DECT (DISPOSABLE) ×3 IMPLANT
STRIP CLOSURE SKIN 1/2X4 (GAUZE/BANDAGES/DRESSINGS) ×2 IMPLANT
SUT MNCRL AB 3-0 PS2 18 (SUTURE) ×3 IMPLANT
SUT VIC AB 1 CT1 36 (SUTURE) ×6 IMPLANT
SUT VIC AB 2-0 CT1 27 (SUTURE) ×4
SUT VIC AB 2-0 CT1 TAPERPNT 27 (SUTURE) ×2 IMPLANT
TOWEL OR 17X26 10 PK STRL BLUE (TOWEL DISPOSABLE) ×6 IMPLANT
TRAY FOLEY W/METER SILVER 14FR (SET/KITS/TRAYS/PACK) IMPLANT
TRAY FOLEY W/METER SILVER 16FR (SET/KITS/TRAYS/PACK) ×3 IMPLANT
WATER STERILE IRR 1500ML POUR (IV SOLUTION) ×3 IMPLANT

## 2015-03-29 NOTE — Interval H&P Note (Signed)
History and Physical Interval Note:  03/29/2015 1:30 PM  Christopher Jennings  has presented today for surgery, with the diagnosis of LEFT FEMORAL NECK FRACTURE  The various methods of treatment have been discussed with the patient and family. After consideration of risks, benefits and other options for treatment, the patient has consented to  Procedure(s): LEFT PERCUTANEOUS PINNING  (Left) as a surgical intervention .  The patient's history has been reviewed, patient examined, no change in status, stable for surgery.  I have reviewed the patient's chart and labs.  Questions were answered to the patient's satisfaction.     Gearlean Alf

## 2015-03-29 NOTE — Anesthesia Preprocedure Evaluation (Addendum)
Anesthesia Evaluation  Patient identified by MRN, date of birth, ID band Patient awake    Reviewed: NPO status , Patient's Chart, lab work & pertinent test results  Airway Mallampati: II  TM Distance: >3 FB Neck ROM: Full    Dental  (+) Edentulous Upper, Edentulous Lower   Pulmonary shortness of breath, COPD Dx of Lung ca c mets by report  breath sounds clear to auscultation        Cardiovascular hypertension, Rhythm:Regular Rate:Normal     Neuro/Psych    GI/Hepatic GERD-  ,(+) Hepatitis -  Endo/Other  diabetes  Renal/GU      Musculoskeletal   Abdominal   Peds  Hematology   Anesthesia Other Findings   Reproductive/Obstetrics                            Anesthesia Physical Anesthesia Plan  ASA: III  Anesthesia Plan: Spinal   Post-op Pain Management:    Induction: Intravenous  Airway Management Planned:   Additional Equipment:   Intra-op Plan:   Post-operative Plan:   Informed Consent: I have reviewed the patients History and Physical, chart, labs and discussed the procedure including the risks, benefits and alternatives for the proposed anesthesia with the patient or authorized representative who has indicated his/her understanding and acceptance.     Plan Discussed with:   Anesthesia Plan Comments:        Anesthesia Quick Evaluation

## 2015-03-29 NOTE — Op Note (Signed)
OPERATIVE REPORT   POSTOPERATIVE DIAGNOSIS: Left femoral neck fracture.   POSTOPERATIVE DIAGNOSIS: Left  femoral neck fracture.   PROCEDURE: In situ pinning, Left hip.   SURGEON: Gaynelle Arabian, M.D.   ASSISTANT: None.   ANESTHESIA: Spinal.   ESTIMATED BLOOD LOSS: Minimal.   DRAINS: None.   COMPLICATIONS: None.   CONDITION: Stable to recovery.   BRIEF CLINICAL NOTE: Christopher Jennings is an 81 y.o. male, had a fall a few weeks agol, sustaining a minimally displaced  Left femoral neck fracture. He has worsening pain in the Left hip. He presents for in situ pinning of the Left femoral neck  fracture.  PROCEDURE IN DETAIL: After successful administration of general  anesthetic, the patient was placed on the fracture table. The Left lower extremity in well-padded traction boot, Right lower extremity in  well-padded leg holder. We did not need to apply traction as this was a  nondisplaced fracture. The C-arm spot AP and lateral confirmed that it  did not displace during positioning on the bed. Thigh was then prepped  and draped in usual sterile fashion. The guide pins for the [employer]  cannulated screws. One of those was passed over the top of the thigh  down in the configuration, so that this is in the center of femoral head  and neck on AP view. The incision was made  over the lateral part of the femur. About a 2-inch incision was then  made at the appropriate place. The skin was cut through the  subcutaneous tissue to the fascia lata, which was incised in line with  skin incision. Guidepin was passed so that this is in the center of  femoral head and neck on the AP view. Another one was placed inferior  to this and slightly posterior and another one was placed superior to  this and slightly posterior. The lengths are 95, 95, and 90  respectively. The screws were passed over the guidepin and tightened  down to the lateral cortex of the femur with excellent purchase of the  screws.  It effectively compressed the fracture site. The guidepins  were removed. Fluoro spots taken AP, lateral, confirming excellent  position of hardware. The incision was then cleaned and dried, and the  deep tissue was closed with interrupted 2-0 Vicryl, subcu with  interrupted 2-0 Vicryl, subcuticular running 4-0 Monocryl. A 30 mL of 0.25% Marcaine  with epinephrine was injected into the subcutaneous tissues. The  incision was cleaned and dried. Steri-Strips and sterile dressing  applied. He was awakened and transported to recovery in stable  condition.   Gaynelle Arabian, M.D.

## 2015-03-29 NOTE — Transfer of Care (Signed)
Immediate Anesthesia Transfer of Care Note  Patient: Christopher Jennings  Procedure(s) Performed: Procedure(s): LEFT PERCUTANEOUS PINNING  (Left)  Patient Location: PACU  Anesthesia Type:Spinal  Level of Consciousness:  sedated, patient cooperative and responds to stimulation  Airway & Oxygen Therapy:Patient Spontanous Breathing and Patient connected to face mask oxgen  Post-op Assessment:  Report given to PACU RN and Post -op Vital signs reviewed and stable  Post vital signs:  Reviewed and stable  Last Vitals:  Filed Vitals:   03/29/15 1146  BP: 130/57  Pulse: 86  Temp: 36.8 C  Resp: 20    Complications: No apparent anesthesia complications, J-85 level on exam denied pain on assessment.

## 2015-03-29 NOTE — H&P (View-Only) (Signed)
Christopher Jennings DOB: [birth year] Married / Language: English / Race: White Male Date of Admission:  03/29/2015 CC:  Left hip pain following a fall History of Present Illness The patient is a 81 year old male who comes in  for a preoperative History and Physical. The patient is scheduled for a left hip pinning to be performed by Dr. Dione Plover. [name], MD at [facility] on 03/29/2015. The patient is a 81 year old male who presented with a left hip problem. The patient reports left hip problems including pain symptoms that have been present for 6 week(s). The symptoms began following a specific injury. The injury occurred due to a fall while the patient was at home. Prior to being seen, the patient was previously evaluated by a primary physician, in [place]. He has just recently moved here. Previous workup for this problem has included hip x-rays (showed impacted femoral neck fracture with severe underlying OA). Previous treatment for this problem has included restricted activity, use of a walker and opioid analgesics (Oxycodone, mainly at night). Note for "Hip problem": He has also been undergoing chemo; he reports his last session was last week. Christopher Jennings was seen in the office upon recommendation from his daughter who is a patient at [facility]. He came in for evaluation and further treatment for his left hip injury. He sustained a fall about 6-8 weeks ago at home when he was digging up the lid to his septic tank and unfortunately slipped on some loose dirt and fell and experienced pain in the left hip area. He was able to stand and walk on the leg so he did not seek medical treatment at that time. His pain was mainly load bearing, but no pain at rest or hardly any pain at night. He had an old crutch that he started to use for mobility. He has been using it up until just a couple of days ago when he had to go to a walker for better support. He delayed seeking treatment for the hip  due to other important ongoing issues. They were getting ready to move and just relocated last week from [place], [place] to [place], [place]. He has also been undergoing chemotherapy recently for a primary small cell carcinoma lung cancer which has already been found to have mestatasized to bone in the area of his low back and his shoulder. He states that he just finished up his chemotherapy this past Friday.  At this time., he has mainly weight bearing pain in the left hip which will radiate down the leg some. He also has some groin and buttock pain. No significant pain as rest and only a little at night when he moves around. He was seen at his PCP back in [place] about two weeks ago and sent for x-rays which proved positive for an impacted left femoral neck fracture. He waited until now to come in due to the recent move. The pain in the hip had started to get a little better but it changed for the worse about a week ago. He has been limited on his activity and movement recently which may just be due to the fact that he had to work more during the recent relocation from [place] to [place]. The femoral neck fracture was confirmed on radiograph in the office and surgery was discussed. Risks and benefits of the left hip pinning were discussed with the patient and he elects to proceed with surgery.  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 Congestive Heart Failure Mother. Depression child Cancer Sister. Heart disease in male family member  before age 96 Diabetes Mellitus Brother, Father, Sister. First Degree Relatives reported Heart Disease Brother.  Social History Exercise Exercises daily; does running / walking Living situation live with spouse Marital status married Current work status retired Children 3 Never consumed alcohol 03/23/2015: Never consumed alcohol Tobacco use Former smoker. 03/23/2015: smoke(d) 3 or more pack(s) per day uses less than 1/2 can(s) smokeless per week Tobacco / smoke exposure 03/23/2015: no No history of drug/alcohol rehab Not under pain contract Number of flights of stairs before winded 2-3  Medication History OxyCODONE HCl ER ('15MG'$  Tab 12HR Deter, Oral as needed) Active. Glimepiride (Oral) Specific dose unknown - Active. Zocor ('20MG'$  Tablet, Oral) Active. HumaLOG Mix 50/50 KwikPen ((50-50) 100UNIT/ML Susp Pen-inj, Subcutaneous) Active. Fentanyl (50MCG/HR , Transdermal) Active. Ondansetron ('8MG'$  Tablet Disperse, Oral) Active.  Past Surgical History  Appendectomy Cataract Surgery bilateral Spinal Surgery  Review of Systems General Present- Weight Loss. Not Present- Chills, Fatigue, Fever, Memory Loss, Night Sweats and Weight Gain. Skin Not Present- Eczema, Hives, Itching, Lesions and Rash. HEENT Not Present- Dentures, Double Vision, Headache, Hearing Loss, Tinnitus and Visual Loss. Respiratory Not Present- Allergies, Chronic Cough, Coughing up blood, Shortness of breath at rest and Shortness of breath with exertion. Cardiovascular Not Present- Chest Pain, Difficulty Breathing Lying Down, Murmur, Palpitations, Racing/skipping heartbeats and Swelling. Gastrointestinal Present- Vomiting (about 4 weeks ago due to chemo). Not Present- Abdominal Pain, Bloody Stool, Constipation, Diarrhea, Difficulty Swallowing, Heartburn, Jaundice, Loss of appetitie and Nausea. Male Genitourinary Present- Urinary frequency. Not Present- Blood in Urine, Discharge, Flank Pain,  Incontinence, Painful Urination, Urgency, Urinary Retention, Urinating at Night and Weak urinary stream. Musculoskeletal Present- Joint Pain (left hip load bearing pain). Not Present- Back Pain, Joint Swelling, Morning Stiffness, Muscle Pain, Muscle Weakness and Spasms. Neurological Not Present- Blackout spells, Difficulty with balance, Dizziness, Paralysis, Tremor and Weakness. Psychiatric Not Present- Insomnia.  Vitals  Weight: 182 lb Height: 73in Body Surface Area: 2.07 m Body Mass Index: 24.01 kg/m  BP: 114/58 (Sitting, Right Arm, Standard)  Physical Exam General Mental Status -Alert, cooperative and good historian. General Appearance-pleasant, Not in acute distress. Orientation-Oriented X3. Build & Nutrition-Well nourished and Well developed.  Head and Neck Head-normocephalic, atraumatic . Neck Global Assessment - supple, no bruit auscultated on the right, no bruit auscultated on the left.  Eye Vision-Wears corrective lenses. Note: blindness in left eye secondary to past shingles. Can only make out silhouettes. Reduce peripheral vision left eye Eyeball - Left -Note: injected. Pupil - Bilateral-Regular and Round. Motion - Bilateral-EOMI.  ENMT Note: upper and lower dentures  Chest and Lung Exam Auscultation Breath sounds - clear at anterior chest wall and clear at posterior chest wall. Adventitious sounds - No Adventitious sounds.  Cardiovascular Auscultation Rhythm - Regular rate and rhythm. Heart Sounds - S1 WNL and S2 WNL. Murmurs & Other Heart Sounds - Auscultation of the heart reveals - No Murmurs.  Abdomen Palpation/Percussion Tenderness - Abdomen is non-tender to palpation. Rigidity (guarding) - Abdomen is soft. Auscultation Auscultation of the abdomen reveals -  Bowel sounds normal.  Male Genitourinary Note: Not done, not pertinent to present illness   Musculoskeletal Note: Left hip flexion about 120, internal 25 with mild  discomfort, external 35 with mild discomfort, abduction limited in wheelchair of about 30. Motor function intact - moving foot and toes well on exam. Sensation intact to left leg.   Assessment & Plan Closed fracture of neck of left femur, initial encounter (S72.002A) Note:Surgical Plans: Left Hip Pinning  Disposition: Home  PCP: Does not have one yet  Anesthesia Issues: None  Signed electronically by Ok Edwards, III PA-C

## 2015-03-30 ENCOUNTER — Encounter (HOSPITAL_COMMUNITY): Payer: Self-pay | Admitting: Orthopedic Surgery

## 2015-03-30 LAB — BASIC METABOLIC PANEL
ANION GAP: 6 (ref 5–15)
BUN: 12 mg/dL (ref 6–20)
CHLORIDE: 103 mmol/L (ref 101–111)
CO2: 26 mmol/L (ref 22–32)
CREATININE: 0.91 mg/dL (ref 0.61–1.24)
Calcium: 8.6 mg/dL — ABNORMAL LOW (ref 8.9–10.3)
GFR calc Af Amer: >60 mL/min (ref >60)
GFR calc non Af Amer: >60 mL/min (ref >60)
Glucose, Bld: 249 mg/dL — ABNORMAL HIGH (ref 65–99)
Potassium: 4.7 mmol/L (ref 3.5–5.1)
Sodium: 135 mmol/L (ref 135–145)

## 2015-03-30 LAB — CBC
HCT: 26.1 % — ABNORMAL LOW (ref 39.0–52.0)
Hemoglobin: 8.6 g/dL — ABNORMAL LOW (ref 13.0–17.0)
MCH: 30.1 pg (ref 26.0–34.0)
MCHC: 33 g/dL (ref 30.0–36.0)
MCV: 91.3 fL (ref 78.0–100.0)
PLATELETS: 98 10*3/uL — AB (ref 150–400)
RBC: 2.86 MIL/uL — AB (ref 4.22–5.81)
RDW: 20.4 % — AB (ref 11.5–15.5)
WBC: 5 10*3/uL (ref 4.0–10.5)

## 2015-03-30 LAB — GLUCOSE, CAPILLARY
Glucose-Capillary: 188 mg/dL — ABNORMAL HIGH (ref 65–99)
Glucose-Capillary: 168 mg/dL — ABNORMAL HIGH (ref 65–99)
Glucose-Capillary: 136 mg/dL — ABNORMAL HIGH (ref 65–99)

## 2015-03-30 MED ORDER — OXYCODONE HCL 10 MG PO TABS: 10.0000 mg | ORAL_TABLET | ORAL | Status: AC | PRN

## 2015-03-30 MED ORDER — ENOXAPARIN SODIUM 40 MG/0.4ML ~~LOC~~ SOLN: 40.0000 mg | SUBCUTANEOUS | Status: AC

## 2015-03-30 MED ORDER — TRAMADOL HCL 50 MG PO TABS: 50.0000 mg | ORAL_TABLET | Freq: Four times a day (QID) | ORAL | Status: AC | PRN

## 2015-03-30 MED ORDER — CYCLOBENZAPRINE HCL 10 MG PO TABS: 10.0000 mg | ORAL_TABLET | Freq: Three times a day (TID) | ORAL | Status: AC | PRN

## 2015-03-30 NOTE — Progress Notes (Signed)
Subjective: 1 Day Post-Op Procedure(s) (LRB): LEFT PERCUTANEOUS PINNING  (Left) Patient reports pain as mild.   Patient seen in rounds for Dr. Wynelle Link. Patient is well, and has had no acute complaints or problems Patient is asking about going home later today.  Will setup discharge and allow home if he does well and meets all goals  Objective: Vital signs in last 24 hours: Temp:  [98 F (36.7 C)-99.3 F (37.4 C)] 98 F (36.7 C) (07/20 0600) Pulse Rate:  [55-99] 94 (07/20 0600) Resp:  [11-20] 16 (07/20 0600) BP: (96-130)/(40-81) 128/74 mmHg (07/20 0600) SpO2:  [94 %-100 %] 97 % (07/20 0600) Weight:  [82.555 kg (182 lb)] 82.555 kg (182 lb) (07/19 1219)  Intake/Output from previous day:  Intake/Output Summary (Last 24 hours) at 03/30/15 0815 Last data filed at 03/30/15 0615  Gross per 24 hour  Intake 2177.5 ml  Output   1250 ml  Net  927.5 ml    Labs:  Recent Labs  03/28/15 1105 03/30/15 0452  HGB 10.0* 8.6*    Recent Labs  03/28/15 1105 03/30/15 0452  WBC 4.2 5.0  RBC 3.35* 2.86*  HCT 30.7* 26.1*  PLT 109* 98*    Recent Labs  03/28/15 1105 03/30/15 0452  NA 133* 135  K 4.2 4.7  CL 101 103  CO2 26 26  BUN 12 12  CREATININE 0.88 0.91  GLUCOSE 209* 249*  CALCIUM 8.8* 8.6*    Recent Labs  03/28/15 1105  INR 1.10    EXAM: General - Patient is Alert, Appropriate and Oriented Extremity - Neurovascular intact Sensation intact distally Dorsiflexion/Plantar flexion intact Dressing - clean, dry, no drainage Motor Function - intact, moving foot and toes well on exam.   Assessment/Plan: 1 Day Post-Op Procedure(s) (LRB): LEFT PERCUTANEOUS PINNING  (Left) Procedure(s) (LRB): LEFT PERCUTANEOUS PINNING  (Left) Past Medical History  Diagnosis Date  . Family history of adverse reaction to anesthesia     sister had difficulty awakening  . Hypertension     past hx pt states is currently not being treated for   . COPD (chronic obstructive pulmonary  disease)   . Shortness of breath dyspnea     climbing stairs, carrying items or walking fast   . Diabetes mellitus without complication     type II  . H/O urinary frequency   . Numbness     hx of in left hand with chemo txs  . Personal history of chemotherapy     last dose 03/18/2015   . Partial blindness     left eye states had swelling of retina/hx of singles   . Shingles     hx of 4 to 5 years ago   . Measles     hx of   . Mumps     hx of   . GERD (gastroesophageal reflux disease)   . Cancer     small cell carcinoma   . Hepatitis     non A non B    Principal Problem:   Fracture of femoral neck, left Active Problems:   Femoral neck fracture  Estimated body mass index is 24.02 kg/(m^2) as calculated from the following:   Height as of this encounter: '6\' 1"'$  (1.854 m).   Weight as of this encounter: 82.555 kg (182 lb). Up with therapy Discharge home with home health if meets goals Diet - Diabetic diet Follow up - in 2 weeks Activity - WBAT Disposition - Home Condition Upon Discharge - improving  at this time D/C Meds - See DC Summary DVT Prophylaxis - Lovenox for ten days and then ASA 325 mg daily for three weeks.  Christopher Muslim, PA-C Orthopaedic Surgery 03/30/2015, 8:15 AM

## 2015-03-30 NOTE — Progress Notes (Signed)
Pt to d/c home. No DME needs. Patient offered pain medication before d/c, which he declined. Patient stated that he was "really not in much pain". AVS reviewed and "My Chart" discussed with pt. Pt capable of verbalizing medications, dressing changes, signs and symptoms of infection, and follow-up appointments. Remains hemodynamically stable. No signs and symptoms of distress. Educated pt to return to ER in the case of SOB, dizziness, or chest pain.

## 2015-03-30 NOTE — Discharge Instructions (Addendum)
Dr. Gaynelle Arabian Total Joint Specialist [facility] [address] [place], [place] [address] [phone]  HIP POSTOPERATIVE DIRECTIONS   Hip Rehabilitation, Guidelines Following Surgery  The results of a hip operation are greatly improved after range of motion and muscle strengthening exercises. Follow all safety measures which are given to protect your hip. If any of these exercises cause increased pain or swelling in your joint, decrease the amount until you are comfortable again. Then slowly increase the exercises. Call your caregiver if you have problems or questions.   HOME CARE INSTRUCTIONS  Remove items at home which could result in a fall. This includes throw rugs or furniture in walking pathways.   ICE to the affected hip every three hours for 30 minutes at a time and then as needed for pain and swelling.  Continue to use ice on the hip for pain and swelling from surgery. You may notice swelling that will progress down to the foot and ankle.  This is normal after surgery.  Elevate the leg when you are not up walking on it.    Continue to use the breathing machine which will help keep your temperature down.  It is common for your temperature to cycle up and down following surgery, especially at night when you are not up moving around and exerting yourself.  The breathing machine keeps your lungs expanded and your temperature down.   DIET You may resume your previous home diet once your are discharged from the hospital.  DRESSING / WOUND CARE / SHOWERING You may shower 3 days after surgery, but keep the wounds dry during showering.  You may use an occlusive plastic wrap (Press'n Seal for example), NO SOAKING/SUBMERGING IN THE BATHTUB.  If the bandage gets wet, change with a clean dry gauze.  If the incision gets wet, pat the wound dry with a clean towel. You may start showering once you are discharged home but do not submerge the incision under water.  Just pat the incision dry and apply a dry gauze dressing on daily. Change the surgical dressing daily and reapply a dry dressing each time.  ACTIVITY Walk with your walker as instructed. Use walker as long as suggested by your caregivers. Avoid periods of inactivity such as sitting longer than an hour when not asleep. This helps prevent blood clots.  You may resume a sexual relationship in one month or when given the OK by your doctor.  You may return to work once you are cleared by your doctor.  Do not drive a car for 6 weeks or until released by you surgeon.  Do not drive while taking narcotics.  WEIGHT BEARING Weight bearing as tolerated with assist device (walker, cane, etc) as directed, use it as long as suggested by your surgeon or therapist, typically at least 4-6 weeks.  POSTOPERATIVE CONSTIPATION PROTOCOL Constipation - defined medically as fewer than three stools per week and severe constipation as less than one stool per week.  One of the most common issues patients have following surgery is constipation.  Even if you have a regular bowel pattern at home, your normal regimen is likely to be disrupted due to multiple reasons following surgery.  Combination of anesthesia, postoperative narcotics, change in appetite and fluid intake all can affect your bowels.  In order to avoid complications following surgery, here are some recommendations in order to help you during your recovery period.  Colace (docusate) - Pick up an over-the-counter form of Colace or  another stool softener and take twice a day as long as you are requiring postoperative pain medications.  Take with a full glass of water daily.  If you experience loose stools or diarrhea, hold the colace until you stool forms back up.  If your symptoms do not get better within 1 week or if they get worse, check with your doctor.  Dulcolax (bisacodyl) - Pick up over-the-counter and take as directed by the product packaging as needed  to assist with the movement of your bowels.  Take with a full glass of water.  Use this product as needed if not relieved by Colace only.   MiraLax (polyethylene glycol) - Pick up over-the-counter to have on hand.  MiraLax is a solution that will increase the amount of water in your bowels to assist with bowel movements.  Take as directed and can mix with a glass of water, juice, soda, coffee, or tea.  Take if you go more than two days without a movement. Do not use MiraLax more than once per day. Call your doctor if you are still constipated or irregular after using this medication for 7 days in a row.  If you continue to have problems with postoperative constipation, please contact the office for further assistance and recommendations.  If you experience "the worst abdominal pain ever" or develop nausea or vomiting, please contact the office immediatly for further recommendations for treatment.  ITCHING  If you experience itching with your medications, try taking only a single pain pill, or even half a pain pill at a time.  You can also use Benadryl over the counter for itching or also to help with sleep.   TED HOSE STOCKINGS Wear the elastic stockings on both legs for three weeks following surgery during the day but you may remove then at night for sleeping.  MEDICATIONS See your medication summary on the After Visit Summary that the nursing staff will review with you prior to discharge.  You may have some home medications which will be placed on hold until you complete the course of blood thinner medication.  It is important for you to complete the blood thinner medication as prescribed by your surgeon.  Continue your approved medications as instructed at time of discharge.  PRECAUTIONS If you experience chest pain or shortness of breath - call 911 immediately for transfer to the hospital emergency department.  If you develop a fever greater that 101 F, purulent drainage from wound, increased  redness or drainage from wound, foul odor from the wound/dressing, or calf pain - CONTACT YOUR SURGEON.                                                   FOLLOW-UP APPOINTMENTS Make sure you keep all of your appointments after your operation with your surgeon and caregivers. You should call the office at the above phone number and make an appointment for approximately two weeks after the date of your surgery or on the date instructed by your surgeon outlined in the "After Visit Summary".  RANGE OF MOTION AND STRENGTHENING EXERCISES  These exercises are designed to help you keep full movement of your hip joint. Follow your caregiver's or physical therapist's instructions. Perform all exercises about fifteen times, three times per day or as directed. Exercise both hips, even if you have had only one  joint replacement. These exercises can be done on a training (exercise) mat, on the floor, on a table or on a bed. Use whatever works the best and is most comfortable for you. Use music or television while you are exercising so that the exercises are a pleasant break in your day. This will make your life better with the exercises acting as a break in routine you can look forward to.  Lying on your back, slowly slide your foot toward your buttocks, raising your knee up off the floor. Then slowly slide your foot back down until your leg is straight again.  Lying on your back spread your legs as far apart as you can without causing discomfort.  Lying on your side, raise your upper leg and foot straight up from the floor as far as is comfortable. Slowly lower the leg and repeat.  Lying on your back, tighten up the muscle in the front of your thigh (quadriceps muscles). You can do this by keeping your leg straight and trying to raise your heel off the floor. This helps strengthen the largest muscle supporting your knee.  Lying on your back, tighten up the muscles of your buttocks both with the legs straight and with  the knee bent at a comfortable angle while keeping your heel on the floor.   IF YOU ARE TRANSFERRED TO A SKILLED REHAB FACILITY If the patient is transferred to a skilled rehab facility following release from the hospital, a list of the current medications will be sent to the facility for the patient to continue.  When discharged from the skilled rehab facility, please have the facility set up the patient's [place] prior to being released. Also, the skilled facility will be responsible for providing the patient with their medications at time of release from the facility to include their pain medication, the muscle relaxants, and their blood thinner medication. If the patient is still at the rehab facility at time of the two week follow up appointment, the skilled rehab facility will also need to assist the patient in arranging follow up appointment in our office and any transportation needs.  MAKE SURE YOU:  Understand these instructions.  Get help right away if you are not doing well or get worse.    Pick up stool softner and laxative for home use following surgery while on pain medications. Do not submerge incision under water. Please use good hand washing techniques while changing dressing each day. May remove the surgical dressing tomorrow, Thursday 03/31/2015, and apply a dry gauze dressing daily. Please use good hand washing techniques while changing dressing each day. May shower starting Friday 04/01/2015 but do not submerge incision under water. Please use a clean towel to pat the incision dry following showers. Continue to use ice for pain and swelling after surgery. Do not use any lotions or creams on the incision until instructed by your surgeon.  Take Lovenox injections for ten days. After completing the Lovenox injections, start a 325 mg Asprin daily for three weeks. After completing the three weeks of 325 mg Aspirin, then resume the Daily 81 mg Aspirin at  home.

## 2015-03-30 NOTE — Evaluation (Signed)
Physical Therapy Evaluation Patient Details Name: Christopher Jennings MRN: [id number] DOB: [birth year] Today's Date: 03/30/2015   History of Present Illness  Pt sustained L femoral neck fx and is s/p pinning.  He recently finished chemo for SCLC  Clinical Impression  Patient tolerating ambulation well. Plans DC today. Will have HHPT.    Follow Up Recommendations Home health PT;Supervision/Assistance - 24 hour    Equipment Recommendations  None recommended by PT    Recommendations for Other Services       Precautions / Restrictions Precautions Precautions: Fall Restrictions Weight Bearing Restrictions: No      Mobility  Bed Mobility Overal bed mobility: Needs Assistance Bed Mobility: Sit to Supine     Supine to sit: Supervision     General bed mobility comments: light min A to support LLE  Transfers Overall transfer level: Needs assistance Equipment used: Rolling walker (2 wheeled) Transfers: Sit to/from Stand Sit to Stand: Min guard         General transfer comment: for safety; cues for hand placement once  Ambulation/Gait Ambulation/Gait assistance: Min guard Ambulation Distance (Feet): 100 Feet Assistive device: Rolling walker (2 wheeled) Gait Pattern/deviations: Step-to pattern;Step-through pattern     General Gait Details: cues for sequence and for safety, position inside of RW.  Stairs Stairs: Yes Stairs assistance: Min assist Stair Management: One rail Left;Step to pattern;Forwards;With cane Number of Stairs: 2 General stair comments: cues for sequence safety.  Wheelchair Mobility    Modified Rankin (Stroke Patients Only)       Balance                                             Pertinent Vitals/Pain Pain Assessment: 0-10 Pain Score: 2  Pain Location: L hip Pain Descriptors / Indicators: Sore Pain Intervention(s): Limited activity within patient's tolerance;Monitored during session;Premedicated before  session;Repositioned;Ice applied    Home Living Family/patient expects to be discharged to:: Private residence Living Arrangements: Spouse/significant other Available Help at Discharge: Family Type of Home: House Home Access: Stairs to enter Entrance Stairs-Rails: Left Entrance Stairs-Number of Steps: 3 Home Layout: One level Home Equipment: [place] - 2 wheels Additional Comments: staying with daughter; recently moved here from [place].  Pt owned a [employer]    Prior Function Level of Independence: Independent with assistive device(s)               Hand Dominance        Extremity/Trunk Assessment   Upper Extremity Assessment: Overall WFL for tasks assessed           Lower Extremity Assessment: LLE deficits/detail   LLE Deficits / Details: decreased hip flexion and abduction strength, advances leg     Communication   Communication: No difficulties  Cognition Arousal/Alertness: Awake/alert Behavior During Therapy: WFL for tasks assessed/performed Overall Cognitive Status: Within Functional Limits for tasks assessed                      General Comments      Exercises General Exercises - Lower Extremity Quad Sets: AROM;Both;10 reps;Supine Short Arc Quad: AROM;Left;10 reps;Supine Heel Slides: AAROM;Left;10 reps;Supine Hip ABduction/ADduction: AAROM;Left;10 reps;Supine      Assessment/Plan    PT Assessment All further PT needs can be met in the next venue of care  PT Diagnosis Difficulty walking   PT Problem List Decreased strength;Decreased range of  motion;Decreased activity tolerance;Decreased mobility;Decreased knowledge of precautions;Decreased safety awareness;Decreased knowledge of use of DME;Pain  PT Treatment Interventions     PT Goals (Current goals can be found in the Care Plan section) Acute Rehab PT Goals Patient Stated Goal: get back to being independent and doing what he wants to do PT Goal Formulation: All assessment and education  complete, DC therapy    Frequency     Barriers to discharge        Co-evaluation               End of Session   Activity Tolerance: Patient tolerated treatment well Patient left: in bed;with call bell/phone within reach Nurse Communication: Mobility status         Time: 7737-3668 PT Time Calculation (min) (ACUTE ONLY): 23 min   Charges:   PT Evaluation $Initial PT Evaluation Tier I: 1 Procedure PT Treatments $Gait Training: 8-22 mins   PT G Codes:        Claretha Cooper 03/30/2015, 12:02 PM Tresa Endo PT [phone]

## 2015-03-30 NOTE — Progress Notes (Signed)
CSW consulted for SNF placement. CSW met with pt / spouse to assist with d/c planning. Pt plans to return home following hospital d/c. RNCM will assist with [facility] services. CSW is available to assist with rehab placement if plan changes.  Werner Lean LCSW [phone]

## 2015-03-30 NOTE — Discharge Summary (Signed)
Physician Discharge Summary   Patient ID: Christopher Jennings MRN: [id number] DOB/AGE: [birth year] 79 y.o.  Admit date: 03/29/2015 Discharge date: 03/30/2015  Primary Diagnosis:  Left femoral neck fracture.   Admission Diagnoses:  Past Medical History  Diagnosis Date  . Family history of adverse reaction to anesthesia     sister had difficulty awakening  . Hypertension     past hx pt states is currently not being treated for   . COPD (chronic obstructive pulmonary disease)   . Shortness of breath dyspnea     climbing stairs, carrying items or walking fast   . Diabetes mellitus without complication     type II  . H/O urinary frequency   . Numbness     hx of in left hand with chemo txs  . Personal history of chemotherapy     last dose 03/18/2015   . Partial blindness     left eye states had swelling of retina/hx of singles   . Shingles     hx of 4 to 5 years ago   . Measles     hx of   . Mumps     hx of   . GERD (gastroesophageal reflux disease)   . Cancer     small cell carcinoma   . Hepatitis     non A non B    Discharge Diagnoses:   Principal Problem:   Fracture of femoral neck, left Active Problems:   Femoral neck fracture  Estimated body mass index is 24.02 kg/(m^2) as calculated from the following:   Height as of this encounter: 6' 1"  (1.854 m).   Weight as of this encounter: 82.555 kg (182 lb).  Procedure:  Procedure(s) (LRB): LEFT PERCUTANEOUS PINNING  (Left)   Consults: None  HPI: Christopher Jennings is an 81 y.o. male, had a fall a few weeks agol, sustaining a minimally displaced Left femoral neck fracture. He has worsening pain in the Left hip. He presents for in situ pinning of the Left femoral neck   Laboratory Data: Admission on 03/29/2015  Component Date Value Ref Range Status  . Glucose-Capillary 03/29/2015 136* 65 - 99 mg/dL Final  . Glucose-Capillary 03/29/2015 112* 65 - 99 mg/dL Final  . WBC 03/30/2015 5.0  4.0 - 10.5 K/uL Final  . RBC  03/30/2015 2.86* 4.22 - 5.81 MIL/uL Final  . Hemoglobin 03/30/2015 8.6* 13.0 - 17.0 g/dL Final  . HCT 03/30/2015 26.1* 39.0 - 52.0 % Final  . MCV 03/30/2015 91.3  78.0 - 100.0 fL Final  . MCH 03/30/2015 30.1  26.0 - 34.0 pg Final  . MCHC 03/30/2015 33.0  30.0 - 36.0 g/dL Final  . RDW 03/30/2015 20.4* 11.5 - 15.5 % Final  . Platelets 03/30/2015 98* 150 - 400 K/uL Final   CONSISTENT WITH PREVIOUS RESULT  . Sodium 03/30/2015 135  135 - 145 mmol/L Final  . Potassium 03/30/2015 4.7  3.5 - 5.1 mmol/L Final  . Chloride 03/30/2015 103  101 - 111 mmol/L Final  . CO2 03/30/2015 26  22 - 32 mmol/L Final  . Glucose, Bld 03/30/2015 249* 65 - 99 mg/dL Final  . BUN 03/30/2015 12  6 - 20 mg/dL Final  . Creatinine, Ser 03/30/2015 0.91  0.61 - 1.24 mg/dL Final  . Calcium 03/30/2015 8.6* 8.9 - 10.3 mg/dL Final  . GFR calc non Af Amer 03/30/2015 >60  >60 mL/min Final  . GFR calc Af Amer 03/30/2015 >60  >60 mL/min Final   Comment: (NOTE)  The eGFR has been calculated using the CKD EPI equation. This calculation has not been validated in all clinical situations. eGFR's persistently <60 mL/min signify possible Chronic Kidney Disease.   . Anion gap 03/30/2015 6  5 - 15 Final  . Glucose-Capillary 03/29/2015 287* 65 - 99 mg/dL Final  . Glucose-Capillary 03/30/2015 188* 65 - 99 mg/dL Final  . Glucose-Capillary 03/30/2015 168* 65 - 99 mg/dL Final  . Comment 1 03/30/2015 Notify RN   Final  . Comment 2 03/30/2015 Document in Chart   Final  . Glucose-Capillary 03/30/2015 136* 65 - 99 mg/dL Final  . Comment 1 03/30/2015 Notify RN   Final  . Comment 2 03/30/2015 Document in Chart   Final  Hospital Outpatient Visit on 03/28/2015  Component Date Value Ref Range Status  . aPTT 03/28/2015 29  24 - 37 seconds Final  . WBC 03/28/2015 4.2  4.0 - 10.5 K/uL Final  . RBC 03/28/2015 3.35* 4.22 - 5.81 MIL/uL Final  . Hemoglobin 03/28/2015 10.0* 13.0 - 17.0 g/dL Final  . HCT 03/28/2015 30.7* 39.0 - 52.0 % Final  . MCV  03/28/2015 91.6  78.0 - 100.0 fL Final  . MCH 03/28/2015 29.9  26.0 - 34.0 pg Final  . MCHC 03/28/2015 32.6  30.0 - 36.0 g/dL Final  . RDW 03/28/2015 20.4* 11.5 - 15.5 % Final  . Platelets 03/28/2015 109* 150 - 400 K/uL Final   Comment: SPECIMEN CHECKED FOR CLOTS REPEATED TO VERIFY PLATELET COUNT CONFIRMED BY SMEAR   . Sodium 03/28/2015 133* 135 - 145 mmol/L Final  . Potassium 03/28/2015 4.2  3.5 - 5.1 mmol/L Final  . Chloride 03/28/2015 101  101 - 111 mmol/L Final  . CO2 03/28/2015 26  22 - 32 mmol/L Final  . Glucose, Bld 03/28/2015 209* 65 - 99 mg/dL Final  . BUN 03/28/2015 12  6 - 20 mg/dL Final  . Creatinine, Ser 03/28/2015 0.88  0.61 - 1.24 mg/dL Final  . Calcium 03/28/2015 8.8* 8.9 - 10.3 mg/dL Final  . Total Protein 03/28/2015 8.1  6.5 - 8.1 g/dL Final  . Albumin 03/28/2015 3.5  3.5 - 5.0 g/dL Final  . AST 03/28/2015 22  15 - 41 U/L Final  . ALT 03/28/2015 10* 17 - 63 U/L Final  . Alkaline Phosphatase 03/28/2015 169* 38 - 126 U/L Final  . Total Bilirubin 03/28/2015 0.8  0.3 - 1.2 mg/dL Final  . GFR calc non Af Amer 03/28/2015 >60  >60 mL/min Final  . GFR calc Af Amer 03/28/2015 >60  >60 mL/min Final   Comment: (NOTE) The eGFR has been calculated using the CKD EPI equation. This calculation has not been validated in all clinical situations. eGFR's persistently <60 mL/min signify possible Chronic Kidney Disease.   . Anion gap 03/28/2015 6  5 - 15 Final  . Prothrombin Time 03/28/2015 14.4  11.6 - 15.2 seconds Final  . INR 03/28/2015 1.10  0.00 - 1.49 Final  . Color, Urine 03/28/2015 YELLOW  YELLOW Final  . APPearance 03/28/2015 CLEAR  CLEAR Final  . Specific Gravity, Urine 03/28/2015 1.027  1.005 - 1.030 Final  . pH 03/28/2015 7.0  5.0 - 8.0 Final  . Glucose, UA 03/28/2015 >1000* NEGATIVE mg/dL Final  . Hgb urine dipstick 03/28/2015 NEGATIVE  NEGATIVE Final  . Bilirubin Urine 03/28/2015 NEGATIVE  NEGATIVE Final  . Ketones, ur 03/28/2015 NEGATIVE  NEGATIVE mg/dL Final  .  Protein, ur 03/28/2015 30* NEGATIVE mg/dL Final  . Urobilinogen, UA 03/28/2015 1.0  0.0 -  1.0 mg/dL Final  . Nitrite 03/28/2015 NEGATIVE  NEGATIVE Final  . Leukocytes, UA 03/28/2015 NEGATIVE  NEGATIVE Final  . MRSA, PCR 03/28/2015 NEGATIVE  NEGATIVE Final  . Staphylococcus aureus 03/28/2015 NEGATIVE  NEGATIVE Final   Comment:        The Xpert SA Assay (FDA approved for NASAL specimens in patients over 33 years of age), is one component of a comprehensive surveillance program.  Test performance has been validated by [facility] for patients greater than or equal to 25 year old. It is not intended to diagnose infection nor to guide or monitor treatment.   . ABO/RH(D) 03/28/2015 A POS   Final  . Antibody Screen 03/28/2015 NEG   Final  . Sample Expiration 03/28/2015 04/01/2015   Final  . ABO/RH(D) 03/28/2015 A POS   Final  . WBC, UA 03/28/2015 0-2  <3 WBC/hpf Final  . RBC / HPF 03/28/2015 0-2  <3 RBC/hpf Final     X-Rays:Dg Chest 2 View  03/28/2015   CLINICAL DATA:  Pre op surgery on 03/29/15. LEFT PERCUTANEOUS PINNING. Ex-smoker. Hx lung cancer. Pt states he just finished chemo last week. Diabetic.  EXAM: CHEST  2 VIEW  COMPARISON:  None.  FINDINGS: Lung volumes are relatively low. There is coarse reticular and patchy irregular opacity in the lungs predominantly in the lung bases, right greater than left, and central upper lobes. There is associated architectural distortion. This is most suggestive of pulmonary fibrosis, likely of least in part due to treatment related scarring on the right.  No pleural effusion or pneumothorax.  Cardiac silhouette is normal in size. No mediastinal or hilar masses or convincing adenopathy.  Bony thorax is demineralized but grossly intact.  IMPRESSION: 1. Lung findings described above are likely due to chronic pulmonary fibrosis and post treatment related scarring. Superimposed pneumonia is not excluded. Since there are no prior studies available at this time  to document chronicity of these findings, followup chest radiographs in 2 months is recommended.   Electronically Signed   By: Lajean Manes M.D.   On: 03/28/2015 12:14   Dg Hip Operative Unilat With Pelvis Left  03/29/2015   CLINICAL DATA:  Left hip fracture.  EXAM: OPERATIVE LEFT HIP (WITH PELVIS IF PERFORMED) 2 VIEWS  TECHNIQUE: Fluoroscopic spot image(s) were submitted for interpretation post-operatively.  COMPARISON:  None.  FINDINGS: AP and lateral views demonstrate that 3 pins have been inserted across the subcapital fracture of the proximal left femur. There is slight rotation of the femoral head. The pins appear in good position.  IMPRESSION: Open reduction and internal fixation of proximal left femur fracture.   Electronically Signed   By: Lorriane Shire M.D.   On: 03/29/2015 15:47    EKG: Orders placed or performed during the hospital encounter of 03/28/15  . EKG 12-Lead  . EKG 12-Lead     Hospital Course: Christopher Jennings is a 81 y.o. who was admitted to [facility]. They were brought to the operating room on 03/29/2015 and underwent Procedure(s): LEFT PERCUTANEOUS PINNING .  Patient tolerated the procedure well and was later transferred to the recovery room and then to the orthopaedic floor for postoperative care.  They were given PO and IV analgesics for pain control following their surgery.  They were given 24 hours of postoperative antibiotics of  Anti-infectives    Start     Dose/Rate Route Frequency Ordered Stop   03/29/15 2200  ceFAZolin (ANCEF) IVPB 2 g/50 mL premix  2 g 100 mL/hr over 30 Minutes Intravenous Every 6 hours 03/29/15 1735 03/30/15 0531   03/29/15 1153  ceFAZolin (ANCEF) IVPB 2 g/50 mL premix     2 g 100 mL/hr over 30 Minutes Intravenous On call to O.R. 03/29/15 1153 03/29/15 1450     and started on DVT prophylaxis in the form of Lovenox.   PT and OT were ordered for total joint protocol.  Discharge planning consulted to help with postop disposition  and equipment needs.  Patient had a good night on the evening of surgery.  They started to get up OOB with therapy on day one.  Patient was seen in rounds and setup discharge and allowed home after meeting all goals and then was ready to go home.  Discharge home with home health if meets goals Diet - Diabetic diet Follow up - in 2 weeks Activity - WBAT Disposition - Home Condition Upon Discharge - improving at this time D/C Meds - See DC Summary DVT Prophylaxis - Lovenox for ten days and then ASA 325 mg daily for three weeks.  Discharge Instructions    Call MD / Call 911    Complete by:  As directed   If you experience chest pain or shortness of breath, CALL 911 and be transported to the hospital emergency room.  If you develope a fever above 101 F, pus (white drainage) or increased drainage or redness at the wound, or calf pain, call your surgeon's office.     Change dressing    Complete by:  As directed   You may change your dressing dressing daily with sterile 4 x 4 inch gauze dressing and paper tape.  Do not submerge the incision under water.     Constipation Prevention    Complete by:  As directed   Drink plenty of fluids.  Prune juice may be helpful.  You may use a stool softener, such as Colace (over the counter) 100 mg twice a day.  Use MiraLax (over the counter) for constipation as needed.     Diet Carb Modified    Complete by:  As directed      Discharge instructions    Complete by:  As directed   Pick up stool softner and laxative for home use following surgery while on pain medications. Do not submerge incision under water. May remove the surgical dressing tomorrow, Thursday 03/31/2015, and apply a dry gauze dressing daily. Please use good hand washing techniques while changing dressing each day. May shower starting Friday 04/01/2015 but do not submerge incision under water. Please use a clean towel to pat the incision dry following showers. Continue to use ice for pain and  swelling after surgery. Do not use any lotions or creams on the incision until instructed by your surgeon.  Take Lovenox injections for ten days. After completing the Lovenox injections, start a 325 mg Asprin daily for three weeks. After completing the three weeks of 325 mg Aspirin, then resume the Daily 81 mg Aspirin at home.  Postoperative Constipation Protocol  Constipation - defined medically as fewer than three stools per week and severe constipation as less than one stool per week.  One of the most common issues patients have following surgery is constipation.  Even if you have a regular bowel pattern at home, your normal regimen is likely to be disrupted due to multiple reasons following surgery.  Combination of anesthesia, postoperative narcotics, change in appetite and fluid intake all can affect your bowels.  In  order to avoid complications following surgery, here are some recommendations in order to help you during your recovery period.  Colace (docusate) - Pick up an over-the-counter form of Colace or another stool softener and take twice a day as long as you are requiring postoperative pain medications.  Take with a full glass of water daily.  If you experience loose stools or diarrhea, hold the colace until you stool forms back up.  If your symptoms do not get better within 1 week or if they get worse, check with your doctor.  Dulcolax (bisacodyl) - Pick up over-the-counter and take as directed by the product packaging as needed to assist with the movement of your bowels.  Take with a full glass of water.  Use this product as needed if not relieved by Colace only.   MiraLax (polyethylene glycol) - Pick up over-the-counter to have on hand.  MiraLax is a solution that will increase the amount of water in your bowels to assist with bowel movements.  Take as directed and can mix with a glass of water, juice, soda, coffee, or tea.  Take if you go more than two days without a movement. Do  not use MiraLax more than once per day. Call your doctor if you are still constipated or irregular after using this medication for 7 days in a row.  If you continue to have problems with postoperative constipation, please contact the office for further assistance and recommendations.  If you experience "the worst abdominal pain ever" or develop nausea or vomiting, please contact the office immediatly for further recommendations for treatment.     Do not sit on low chairs, stoools or toilet seats, as it may be difficult to get up from low surfaces    Complete by:  As directed      Driving restrictions    Complete by:  As directed   No driving until released by the physician.     Increase activity slowly as tolerated    Complete by:  As directed      Lifting restrictions    Complete by:  As directed   No lifting until released by the physician.     Patient may shower    Complete by:  As directed   You may shower without a dressing once there is no drainage.  Do not wash over the wound.  If drainage remains, do not shower until drainage stops.     TED hose    Complete by:  As directed   Use stockings (TED hose) for 3 weeks on both leg(s).  You may remove them at night for sleeping.     Weight bearing as tolerated    Complete by:  As directed   Laterality:  left  Extremity:  Lower            Medication List    STOP taking these medications        aspirin EC 81 MG tablet      TAKE these medications        calcium carbonate 500 MG chewable tablet  Commonly known as:  TUMS - dosed in mg elemental calcium  Chew 1 tablet by mouth daily.     cyclobenzaprine 10 MG tablet  Commonly known as:  FLEXERIL  Take 1 tablet (10 mg total) by mouth 3 (three) times daily as needed for muscle spasms.     docusate sodium 100 MG capsule  Commonly known as:  COLACE  Take 100 mg  by mouth 2 (two) times daily as needed for mild constipation.     enoxaparin 40 MG/0.4ML injection  Commonly known  as:  LOVENOX  Inject 0.4 mLs (40 mg total) into the skin daily. Take Lovenox injections for ten days and then switch to a 325 mg Aspirin daily for three more weeks.     fentaNYL 50 MCG/HR  Commonly known as:  DURAGESIC - dosed mcg/hr  Place 50 mcg onto the skin every 3 (three) days.     glimepiride 4 MG tablet  Commonly known as:  AMARYL  Take 4 mg by mouth 2 (two) times daily.     insulin lispro protamine-lispro (50-50) 100 UNIT/ML Susp injection  Commonly known as:  HUMALOG 50/50 MIX  Inject 10-25 Units into the skin 3 (three) times daily. Sliding scale     ondansetron 8 MG disintegrating tablet  Commonly known as:  ZOFRAN-ODT  Take 8 mg by mouth every 8 (eight) hours as needed for nausea or vomiting.     Oxycodone HCl 10 MG Tabs  Take 1-2 tablets (10-20 mg total) by mouth every 4 (four) hours as needed for moderate pain or severe pain ((for MODERATE breakthrough pain)).     polyethylene glycol packet  Commonly known as:  MIRALAX / GLYCOLAX  Take 17 g by mouth daily.     simvastatin 20 MG tablet  Commonly known as:  ZOCOR  Take 20 mg by mouth daily.     traMADol 50 MG tablet  Commonly known as:  ULTRAM  Take 1-2 tablets (50-100 mg total) by mouth every 6 (six) hours as needed (mild pain).     Vitamin B-12 2500 MCG Subl  Place 2,500 mcg under the tongue daily.           Follow-up Information    Follow up with Gearlean Alf, MD On 04/12/2015.   Specialty:  Orthopedic Surgery   Why:  Call office at [phone] to setup followup appointment on Tuesday 04/12/2015 with Dr. Wynelle Link.   Contact information:   [address] [place] [address] [phone]       Signed: Arlee Muslim, PA-C Orthopaedic Surgery 03/30/2015, 4:20 PM

## 2015-03-30 NOTE — Evaluation (Signed)
Occupational Therapy Evaluation Patient Details Name: CHASYN CINQUE MRN: [id number] DOB: [birth year] Today's Date: 03/30/2015    History of Present Illness Pt sustained L femoral neck fx and is s/p pinning.  He recently finished chemo for SCLC   Clinical Impression   This 81 year old man was admitted for the above.  All education was completed.  No further OT is needed at this time    Follow Up Recommendations  No OT follow up    Equipment Recommendations  None recommended by OT    Recommendations for Other Services       Precautions / Restrictions Precautions Precautions: Fall Restrictions Weight Bearing Restrictions: No      Mobility Bed Mobility Overal bed mobility: Needs Assistance Bed Mobility: Supine to Sit     Supine to sit: Min assist     General bed mobility comments: light min A to support LLE  Transfers Overall transfer level: Needs assistance Equipment used: Rolling walker (2 wheeled) Transfers: Sit to/from Stand Sit to Stand: Min guard         General transfer comment: for safety; cues for hand placement once    Balance                                            ADL Overall ADL's : Needs assistance/impaired                         Toilet Transfer: Min guard;Ambulation;BSC       Tub/ Shower Transfer: Walk-in shower;Min guard;Ambulation     General ADL Comments: Pt injured hip several weeks ago and has been independent with adls.  He can get AE or have help as needed.  Cues given for safety sidestepping through tight space in bathroom and once for hand placement with RW.       Vision     Perception     Praxis      Pertinent Vitals/Pain Pain Assessment: 0-10 Pain Score: 3  Pain Location: L hip Pain Descriptors / Indicators: Sore Pain Intervention(s): Limited activity within patient's tolerance;Monitored during session;Premedicated before session;Repositioned (removed ice)     Hand Dominance      Extremity/Trunk Assessment Upper Extremity Assessment Upper Extremity Assessment: Overall WFL for tasks assessed           Communication Communication Communication: No difficulties   Cognition Arousal/Alertness: Awake/alert Behavior During Therapy: WFL for tasks assessed/performed Overall Cognitive Status: Within Functional Limits for tasks assessed                     General Comments       Exercises       Shoulder Instructions      Home Living Family/patient expects to be discharged to:: Private residence Living Arrangements: Spouse/significant other                 Bathroom Shower/Tub: Occupational psychologist: Standard     Home Equipment: Bedside commode;Shower seat   Additional Comments: staying with daughter; recently moved here from [place].  Pt owned a [place]      Prior Functioning/Environment Level of Independence: Independent             OT Diagnosis: Acute pain   OT Problem List:     OT Treatment/Interventions:  OT Goals(Current goals can be found in the care plan section) Acute Rehab OT Goals Patient Stated Goal: get back to being independent and doing what he wants to do  OT Frequency:     Barriers to D/C:            Co-evaluation              End of Session    Activity Tolerance: Patient tolerated treatment well Patient left: in chair;with call bell/phone within reach;with family/visitor present   Time: 7001-7494 OT Time Calculation (min): 20 min Charges:  OT General Charges $OT Visit: 1 Procedure OT Evaluation $Initial OT Evaluation Tier I: 1 Procedure G-Codes:    Kristyne Woodring Apr 05, 2015, 10:28 AM Lesle Chris, OTR/L [phone] 04-05-15

## 2015-03-30 NOTE — Care Management Note (Signed)
Case Management Note  Patient Details  Name: Christopher Jennings MRN: [id number] Date of Birth: 27-Aug-1933  Subjective/Objective:                   Left femoral neck fracture.  Action/Plan:  Discharge planning Expected Discharge Date:  03/30/15               Expected Discharge Plan:  [place]  In-House Referral:     Discharge planning Services  CM Consult  Post Acute Care Choice:  Home Health Choice offered to:     DME Arranged:    DME Agency:     HH Arranged:  PT [place]:  [place]  Status of Service:  Completed, signed off  Medicare Important Message Given:    Date Medicare IM Given:    Medicare IM give by:    Date Additional Medicare IM Given:    Additional Medicare Important Message give by:     If discussed at [place] of Stay Meetings, dates discussed:    Additional Comments: CM met with pt in room to offer choice of home health agency.  Pt chooses [facility] to render HHPT.  Address and contact information verified by pt.  Referral called to [facility] rep, Kristen.  No DME needed as pt has both rolling walker and 3n1 from previous sx.  No other CM needs were communicated. Dellie Catholic, RN 03/30/2015, 11:01 AM

## 2015-03-31 NOTE — Anesthesia Postprocedure Evaluation (Signed)
  Anesthesia Post-op Note  Patient: Christopher Jennings  Procedure(s) Performed: Procedure(s): LEFT PERCUTANEOUS PINNING  (Left)  Patient Location: PACU  Anesthesia Type:Spinal  Level of Consciousness: awake and alert   Airway and Oxygen Therapy: Patient Spontanous Breathing  Post-op Pain: none  Post-op Assessment: Post-op Vital signs reviewed LLE Motor Response: Purposeful movement   RLE Motor Response: Purposeful movement   L Sensory Level: S1-Sole of foot, small toes R Sensory Level: S1-Sole of foot, small toes  Post-op Vital Signs: stable  Last Vitals:  Filed Vitals:   03/30/15 1437  BP: 132/70  Pulse: 88  Temp: 36.7 C  Resp: 16    Complications: No apparent anesthesia complications

## 2015-05-12 DEATH — deceased

## 2016-09-28 IMAGING — RF DG HIP (WITH PELVIS) OPERATIVE*L*
1 series · 2 of 2 positions shown · non-contrast
Comparison: None.

CLINICAL DATA: Left hip fracture.

EXAM:
OPERATIVE LEFT HIP (WITH PELVIS IF PERFORMED) 2 VIEWS
TECHNIQUE: Fluoroscopic spot image(s) were submitted for interpretation
post-operatively.

[Series 1: run · 2 of 2 slices shown]
[im 1/2]
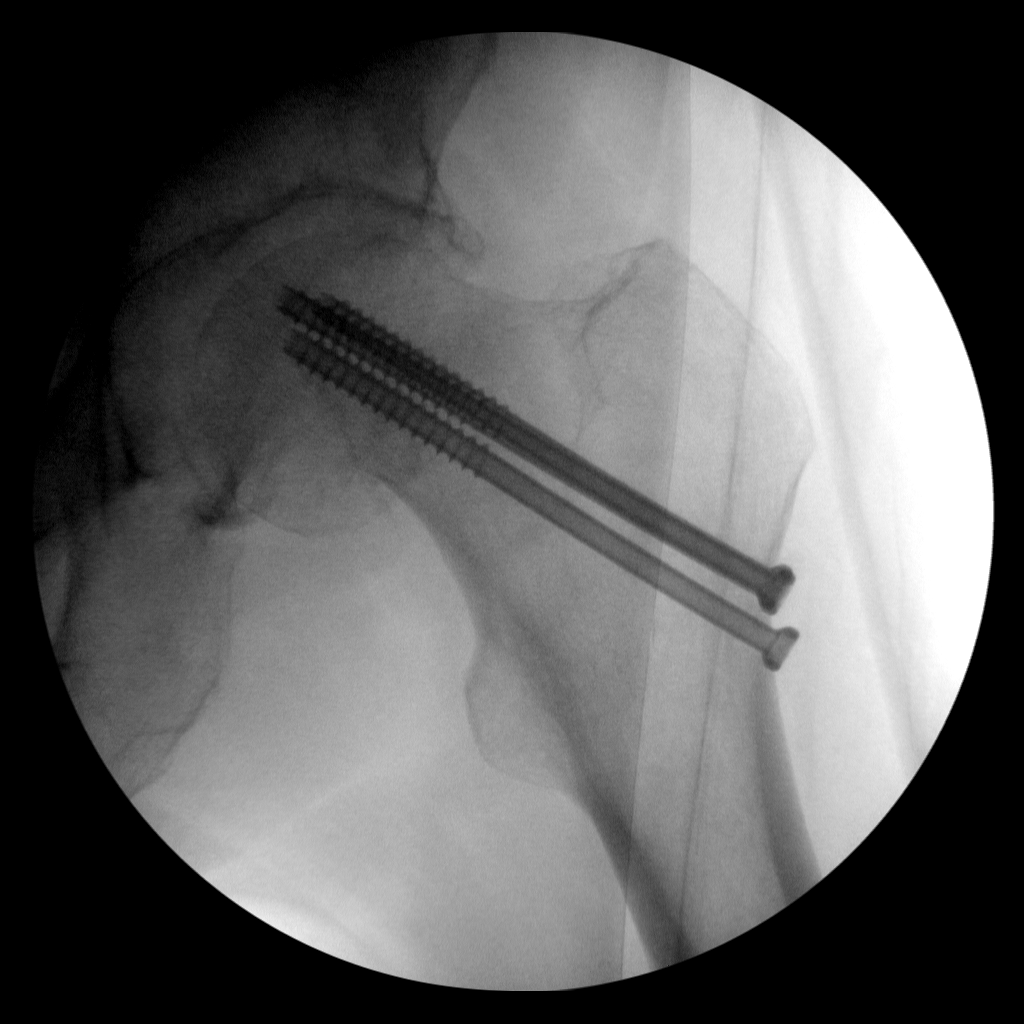
[im 2/2]
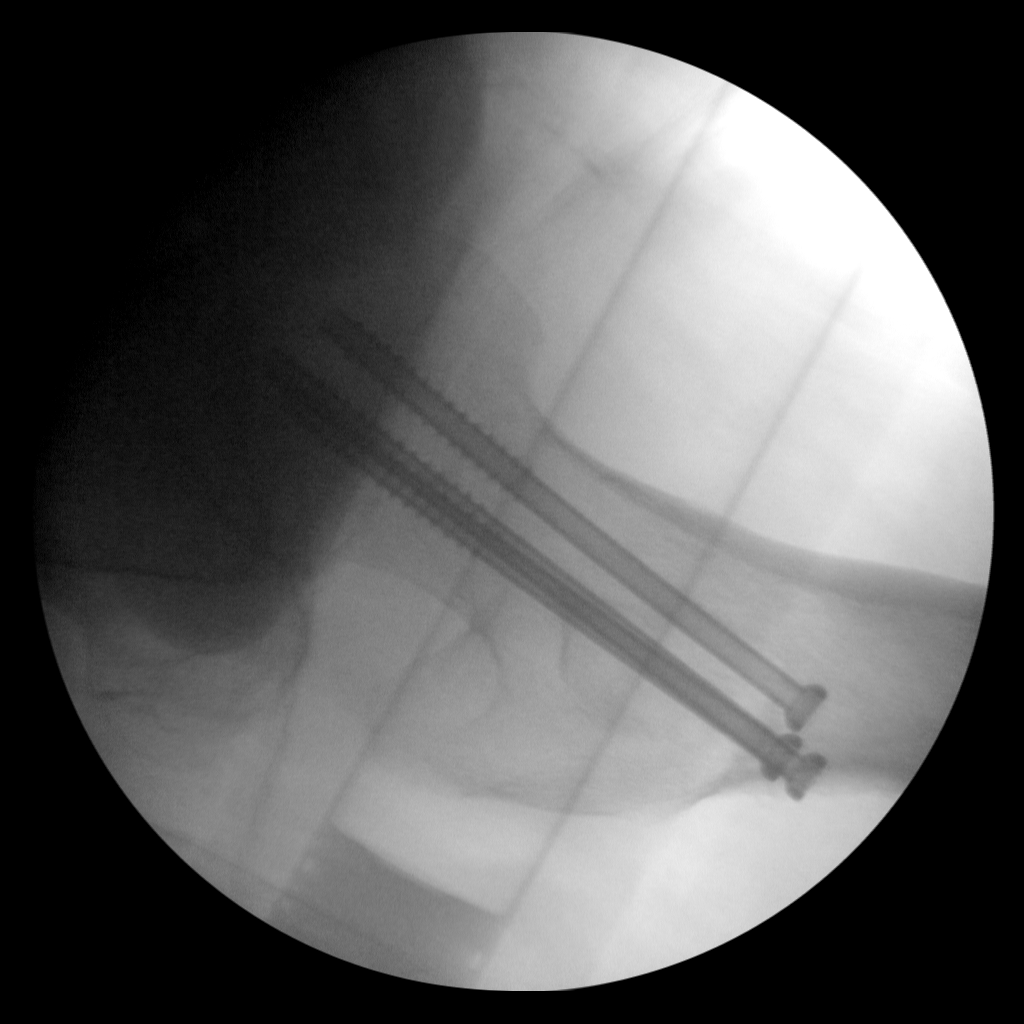

[2 of 2 positions shown; findings below may reference images not displayed]

FINDINGS: AP and lateral views demonstrate that 3 pins have been inserted
across the subcapital fracture of the proximal left femur. There is
slight rotation of the femoral head. The pins appear in good
position.
IMPRESSION: Open reduction and internal fixation of proximal left femur
fracture.
